# Patient Record
Sex: Male | Born: 1985 | Race: White | Hispanic: No | Marital: Married | Smoking: Never smoker
Health system: Southern US, Community
[De-identification: ages and names within clinical notes are randomized; demographics above are authoritative.]

## PROBLEM LIST (undated history)

## (undated) DIAGNOSIS — I10 Essential (primary) hypertension: Principal | ICD-10-CM

## (undated) HISTORY — DX: Essential (primary) hypertension: I10

---

## 2010-11-03 ENCOUNTER — Ambulatory Visit (INDEPENDENT_AMBULATORY_CARE_PROVIDER_SITE_OTHER): Payer: BC Managed Care – PPO | Admitting: Family Medicine

## 2010-11-03 ENCOUNTER — Encounter: Payer: Self-pay | Admitting: Family Medicine

## 2010-11-03 DIAGNOSIS — R7309 Other abnormal glucose: Secondary | ICD-10-CM

## 2010-11-03 NOTE — Patient Instructions (Signed)
Return for a physical with fasting labs in 6 mos.

## 2010-11-03 NOTE — Progress Notes (Signed)
  Subjective:    Patient ID: Ryan Hubbard, male    DOB: 1986-03-23, 25 y.o.   MRN: [id number]  HPI  25 yo WM presents for NOV.  He is doing well.  He wanted to establish care.  Had labs done at work last year and his fasting glucose was 101.  His mom had DM.  He eats healthy and exercises regularly.  Denies any problems.  Takes no meds.  BP 146/58  Pulse 56  Ht 6' (1.829 m)  Wt 173 lb (78.472 kg)  BMI 23.46 kg/m2  SpO2 98%  History reviewed. No pertinent past medical history.  History reviewed. No pertinent past surgical history.  Family History  Problem Relation Age of Onset  . Cancer Mother   . Diabetes Mother     History   Social History  . Marital Status: N/A    Spouse Name: N/A    Number of Children: 0  . Years of Education: 16   Occupational History  . 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 Smoking status: Never Smoker   . Smokeless tobacco: Not on file  . Alcohol Use: 1.2 oz/week    2 Shots of liquor per week  . Drug Use: No  . Sexually Active: Yes   Other Topics Concern  . Not on file   Social History Narrative  . No narrative on file    Not on File  No current outpatient prescriptions on file.     Review of Systems  Constitutional: Negative for fever, fatigue and unexpected weight change.  HENT: Negative for congestion and sore throat.   Eyes: Negative for visual disturbance.  Respiratory: Negative for shortness of breath.   Cardiovascular: Negative for chest pain and palpitations.  Gastrointestinal: Negative for nausea, abdominal pain, diarrhea, constipation and blood in stool.  Genitourinary: Negative for difficulty urinating.  Musculoskeletal: Negative for myalgias and arthralgias.  Neurological: Negative for headaches.  Psychiatric/Behavioral: Negative for dysphoric mood. The patient is not nervous/anxious.        Objective:   Physical Exam  Constitutional: He appears well-developed and well-nourished. No distress.    HENT:  Head: Normocephalic and atraumatic.  Neck: No thyromegaly present.  Cardiovascular: Normal rate, regular rhythm and normal heart sounds.   No murmur heard. Pulmonary/Chest: Effort normal and breath sounds normal. No respiratory distress. He has no wheezes.  Musculoskeletal: He exhibits no edema.  Skin: Skin is warm and dry.  Psychiatric: He has a normal mood and affect.          Assessment & Plan:  Glucose from 2011 was 101 fasting, only 1 point above normal with fam hx of T2DM.  He has a normal BMI, eats healthy and is definitely exercising more.  I don't see a reason to screen him now since he is not fasting today.  Will get labs with his physical in 6 mos.

## 2011-05-06 ENCOUNTER — Ambulatory Visit: Payer: BC Managed Care – PPO | Admitting: Family Medicine

## 2012-09-10 ENCOUNTER — Encounter: Payer: Self-pay | Admitting: Emergency Medicine

## 2012-09-10 ENCOUNTER — Emergency Department (INDEPENDENT_AMBULATORY_CARE_PROVIDER_SITE_OTHER)
Admission: EM | Admit: 2012-09-10 | Discharge: 2012-09-10 | Disposition: A | Discharge status: Home / Self Care | Payer: BC Managed Care – PPO | Source: Home / Self Care | Attending: Family Medicine | Admitting: Family Medicine

## 2012-09-10 DIAGNOSIS — S61209A Unspecified open wound of unspecified finger without damage to nail, initial encounter: Secondary | ICD-10-CM

## 2012-09-10 DIAGNOSIS — Z23 Encounter for immunization: Secondary | ICD-10-CM

## 2012-09-10 MED ORDER — TETANUS-DIPHTH-ACELL PERTUSSIS 5-2.5-18.5 LF-MCG/0.5 IM SUSP
0.5000 mL | Freq: Once | INTRAMUSCULAR | Status: AC
  Administered 2012-09-10: 0.5 mL via INTRAMUSCULAR

## 2012-09-10 NOTE — ED Provider Notes (Signed)
History     CSN: [id number]  Arrival date & time 09/10/12  2017   First MD Initiated Contact with Patient 09/10/12 2024      Chief Complaint  Patient presents with  . Extremity Laceration       HPI Comments: Patient accidentally cut his left 4th fingertip with a knife today.  He is not sure about his last Tdap  Patient is a 27 y.o. male presenting with skin laceration. The history is provided by the patient.  Laceration Location:  Finger Finger laceration location:  L ring finger Length (cm):  6mm Depth:  Through dermis Quality: avulsion   Bleeding: controlled with pressure   Laceration mechanism:  Knife Pain details:    Quality:  Aching   Severity:  Mild Foreign body present:  No foreign bodies Tetanus status:  Out of date   History reviewed. No pertinent past medical history.  History reviewed. No pertinent past surgical history.  Family History  Problem Relation Age of Onset  . Cancer Mother   . Diabetes Mother     History  Substance Use Topics  . Smoking status: Never Smoker   . Smokeless tobacco: Not on file  . Alcohol Use: 1.2 oz/week    2 Shots of liquor per week      Review of Systems  All other systems reviewed and are negative.    Allergies  Review of patient's allergies indicates no known allergies.  Home Medications  No current outpatient prescriptions on file.  BP 147/76  Pulse 96  Temp(Src) 98.4 F (36.9 C) (Oral)  Ht 6' (1.829 m)  Wt 185 lb (83.915 kg)  BMI 25.08 kg/m2  SpO2 100%  Physical Exam  Nursing note and vitals reviewed. Constitutional: He is oriented to person, place, and time. He appears well-developed and well-nourished. No distress.  Eyes: Conjunctivae are normal. Pupils are equal, round, and reactive to light.  Musculoskeletal:       Hands: Left 4th finger has a 6mm dia superficial skin avulsion as noted on diagram.  Wound is clean without debris.  Neurological: He is alert and oriented to person, place, and  time.  Skin: Skin is warm and dry.    ED Course  Procedures  Cleansed wound with HibiClens and Saline.  Applied Surgicil.  After bleeding ceased, applied Mepelex Lite dressing and Bacitracin ointment.      1. Avulsion of skin of finger, initial encounter       MDM  Tdap administered. Applied Mepelex Lite dressing over thin layer of Bacitracin ointment, followed by light compression dressing and stack splint.  Recommend that he leave Mepelex in place for about 3 to 4 days, then change (gave remaining piece of Mepelex Lite).  Return for any signs of infection        Lattie Haw, MD 09/11/12 1113

## 2012-09-10 NOTE — ED Notes (Signed)
Left fourth finger laceration with a knife

## 2012-09-13 ENCOUNTER — Telehealth: Payer: Self-pay | Admitting: Emergency Medicine

## 2013-05-28 ENCOUNTER — Encounter: Payer: Self-pay | Admitting: Family Medicine

## 2013-05-28 ENCOUNTER — Ambulatory Visit (INDEPENDENT_AMBULATORY_CARE_PROVIDER_SITE_OTHER): Payer: BC Managed Care – PPO

## 2013-05-28 ENCOUNTER — Ambulatory Visit (INDEPENDENT_AMBULATORY_CARE_PROVIDER_SITE_OTHER): Payer: BC Managed Care – PPO | Admitting: Family Medicine

## 2013-05-28 VITALS — BP 153/93 | HR 67 | Wt 190.0 lb

## 2013-05-28 DIAGNOSIS — M25539 Pain in unspecified wrist: Secondary | ICD-10-CM

## 2013-05-28 DIAGNOSIS — M6740 Ganglion, unspecified site: Secondary | ICD-10-CM

## 2013-05-28 DIAGNOSIS — IMO0001 Reserved for inherently not codable concepts without codable children: Secondary | ICD-10-CM

## 2013-05-28 DIAGNOSIS — M25532 Pain in left wrist: Secondary | ICD-10-CM

## 2013-05-28 DIAGNOSIS — R030 Elevated blood-pressure reading, without diagnosis of hypertension: Secondary | ICD-10-CM

## 2013-05-28 HISTORY — DX: Ganglion, unspecified site: M67.40

## 2013-05-28 NOTE — Patient Instructions (Signed)
DASH Diet  The DASH diet stands for "Dietary Approaches to Stop Hypertension." It is a healthy eating plan that has been shown to reduce high blood pressure (hypertension) in as little as 14 days, while also possibly providing other significant health benefits. These other health benefits include reducing the risk of breast cancer after menopause and reducing the risk of type 2 diabetes, heart disease, colon cancer, and stroke. Health benefits also include weight loss and slowing kidney failure in patients with chronic kidney disease.   DIET GUIDELINES  · Limit salt (sodium). Your diet should contain less than 1500 mg of sodium daily.  · Limit refined or processed carbohydrates. Your diet should include mostly whole grains. Desserts and added sugars should be used sparingly.  · Include small amounts of heart-healthy fats. These types of fats include nuts, oils, and tub margarine. Limit saturated and trans fats. These fats have been shown to be harmful in the body.  CHOOSING FOODS   The following food groups are based on a 2000 calorie diet. See your Registered Dietitian for individual calorie needs.  Grains and Grain Products (6 to 8 servings daily)  · Eat More Often: Whole-wheat bread, brown rice, whole-grain or wheat pasta, quinoa, popcorn without added fat or salt (air popped).  · Eat Less Often: White bread, white pasta, white rice, cornbread.  Vegetables (4 to 5 servings daily)  · Eat More Often: Fresh, frozen, and canned vegetables. Vegetables may be raw, steamed, roasted, or grilled with a minimal amount of fat.  · Eat Less Often/Avoid: Creamed or fried vegetables. Vegetables in a cheese sauce.  Fruit (4 to 5 servings daily)  · Eat More Often: All fresh, canned (in natural juice), or frozen fruits. Dried fruits without added sugar. One hundred percent fruit juice (½ cup [237 mL] daily).  · Eat Less Often: Dried fruits with added sugar. Canned fruit in light or heavy syrup.  Lean Meats, Fish, and Poultry (2  servings or less daily. One serving is 3 to 4 oz [85-114 g]).  · Eat More Often: Ninety percent or leaner ground beef, tenderloin, sirloin. Round cuts of beef, chicken breast, turkey breast. All fish. Grill, bake, or broil your meat. Nothing should be fried.  · Eat Less Often/Avoid: Fatty cuts of meat, turkey, or chicken leg, thigh, or wing. Fried cuts of meat or fish.  Dairy (2 to 3 servings)  · Eat More Often: Low-fat or fat-free milk, low-fat plain or light yogurt, reduced-fat or part-skim cheese.  · Eat Less Often/Avoid: Milk (whole, 2%). Whole milk yogurt. Full-fat cheeses.  Nuts, Seeds, and Legumes (4 to 5 servings per week)  · Eat More Often: All without added salt.  · Eat Less Often/Avoid: Salted nuts and seeds, canned beans with added salt.  Fats and Sweets (limited)  · Eat More Often: Vegetable oils, tub margarines without trans fats, sugar-free gelatin. Mayonnaise and salad dressings.  · Eat Less Often/Avoid: Coconut oils, palm oils, butter, stick margarine, cream, half and half, cookies, candy, pie.  FOR MORE INFORMATION  The Dash Diet Eating Plan: www.dashdiet.org  Document Released: 06/09/2011 Document Revised: 09/12/2011 Document Reviewed: 06/09/2011  [employer]® Patient Information ©2014 [employer].

## 2013-05-28 NOTE — Progress Notes (Addendum)
CC: Ryan Hubbard is a 27 y.o. male is here for ganglion cyst ?   Subjective: HPI:  Left wrist pain that has been present for one month described as dull ache at the base of the thumb lateral surface of wrist nonradiating. Worse with heavy lifting and cross fit. Reports fracturing base of thumb in high school. Pain is been preceded by a small lump at the base of his thumb near the site of discomfort that has been present for 6 months has not been getting better or worse in size but is slightly tender over the past 2 weeks. No interventions as of yet. Denies decreased range of motion or any particular motion that causes pain of either issues above. Pain is presently daily basis mild/moderate severity, can occur anytime of the day. Nothing makes better or worse other than above  Patient reports that his blood pressure has always been perfect well below 140/90 when checked at his work, most recently checked 3 months ago. Strong family history of heart disease. He reports that he does indulge in a high sodium diet but does exercise for at least an hour most days of the week. Denies chest pain, shortness of breath, orthopnea, peripheral edema, headache, motor sensory disturbances   Review Of Systems Outlined In HPI  History reviewed. No pertinent past medical history.   Family History  Problem Relation Age of Onset  . Cancer Mother   . Diabetes Mother      History  Substance Use Topics  . Smoking status: Never Smoker   . Smokeless tobacco: Not on file  . Alcohol Use: 1.2 oz/week    2 Shots of liquor per week     Objective: Filed Vitals:   05/28/13 1320  BP: 153/93  Pulse: 67    General: Alert and Oriented, No Acute Distress HEENT: Pupils equal, round, reactive to light. Conjunctivae clear. Moist mucous membranes pharynx unremarkable Lungs: Clear to auscultation bilaterally, no wheezing/ronchi/rales.  Comfortable work of breathing. Good air movement. Cardiac: Regular rate and rhythm.  Normal S1/S2.  No murmurs, rubs, nor gallops.   Extremities: No peripheral edema.  Strong peripheral pulses. Radiocarpal region on the palmar lateral surface of the left wrist has a approximately 1 cm firm Subcutaneous nodule that is fixed and non-pulsatile.  On ultrasound there is a hypoechoic single-chamber cystic structure corresponding with this nodule , it is nontender . The left wrist has full range of motion and strength without pain in anatomical snuff box and a negative Lourena Simmonds there is no redness or warmth to any aspect of the left wrist.  Mental Status: No depression, anxiety, nor agitation. Skin: Warm and dry.  Assessment & Plan: Ryan Hubbard was seen today for ganglion cyst ?.  Diagnoses and associated orders for this visit:  Left wrist pain - DG Wrist Complete Left; Future  Ganglion cyst  Elevated BP    Left wrist pain: Obtain x-ray given his remote history of fracture, suspicious that arthritis may be contributing to his ganglion cyst. If there is radiographic evidence of arthritis I will send him to Dr. Karie Schwalbe. for sports medicine referral for consideration of ganglion cyst aspiration plus or minus steroid injection  elevated blood pressure: Patient was counseled on the Dash diet and exercise interventions to help reduce blood pressure, if his blood pressure is still above 140/90 at followup visit with sports medicine I like him to follow up with me as soon as possible to discuss antihypertensive medications   Return in about 1 week (  around 06/04/2013).

## 2013-06-04 ENCOUNTER — Ambulatory Visit (INDEPENDENT_AMBULATORY_CARE_PROVIDER_SITE_OTHER): Payer: BC Managed Care – PPO | Admitting: Sports Medicine

## 2013-06-04 ENCOUNTER — Encounter: Payer: Self-pay | Admitting: Sports Medicine

## 2013-06-04 VITALS — BP 151/88 | HR 87 | Wt 191.0 lb

## 2013-06-04 DIAGNOSIS — M6740 Ganglion, unspecified site: Secondary | ICD-10-CM

## 2013-06-04 NOTE — Progress Notes (Signed)
   Subjective:    I'm seeing this patient as a consultation for:  Dr. Ivan Anchors  CC: Left wrist mass  HPI: This is a very pleasant 27 year old male, for several months now he's had a mass he localizes in the volar radial aspect of his left wrist, he doesn't have any recent injuries but does lots of lifting, and power cleans in the gym. The mass is nontender except when he is lifting weights. Localized, moderate, persistent.  Past medical history, Surgical history, Family history not pertinant except as noted below, Social history, Allergies, and medications have been entered into the medical record, reviewed, and no changes needed.   Review of Systems: No headache, visual changes, nausea, vomiting, diarrhea, constipation, dizziness, abdominal pain, skin rash, fevers, chills, night sweats, weight loss, swollen lymph nodes, body aches, joint swelling, muscle aches, chest pain, shortness of breath, mood changes, visual or auditory hallucinations.   Objective:   General: Well Developed, well nourished, and in no acute distress.  Neuro/Psych: Alert and oriented x3, extra-ocular muscles intact, able to move all 4 extremities, sensation grossly intact. Skin: Warm and dry, no rashes noted.  Respiratory: Not using accessory muscles, speaking in full sentences, trachea midline.  Cardiovascular: Pulses palpable, no extremity edema. Abdomen: Does not appear distended. Left Wrist: There is a visible and palpable for ganglion cyst in close approximation with the radial artery. Palpation is normal over metacarpals, navicular, lunate, and TFCC; tendons without tenderness/ swelling No snuffbox tenderness. No tenderness over Canal of Guyon. Strength 5/5 in all directions without pain. Negative Finkelstein, tinel's and phalens. Negative Watson's test.  Procedure: Real-time Ultrasound Guided aspiration/Injection of volar wrist ganglion cyst. Device: GE Logiq E  Verbal informed consent obtained.  Time-out  conducted.  Noted no overlying erythema, induration, or other signs of local infection.  Skin prepped in a sterile fashion.  Local anesthesia: Topical Ethyl chloride.  With sterile technique and under real time ultrasound guidance:  Using 5 cc of lidocaine with epinephrine local anesthesia was applied, afterwards an 18-gauge needle was advanced into the ganglion cyst under real-time guidance, a small amount of thick fluid is aspirated, syringe was switched and 0.5 cc Kenalog 40, 0.5 cc lidocaine injected easily. Completed without difficulty  Pain immediately resolved suggesting accurate placement of the medication.  Advised to call if fevers/chills, erythema, induration, drainage, or persistent bleeding.  Images permanently stored and available for review in the ultrasound unit.  Impression: Technically successful ultrasound guided injection.  Impression and Recommendations:   This case required medical decision making of moderate complexity.

## 2013-06-04 NOTE — Assessment & Plan Note (Signed)
Aspiration and injection as above. Take it easy in the gym for the next week. Return to see me in one month.

## 2013-06-20 ENCOUNTER — Ambulatory Visit: Payer: BC Managed Care – PPO | Admitting: Family Medicine

## 2013-07-02 ENCOUNTER — Encounter: Payer: Self-pay | Admitting: Sports Medicine

## 2013-07-02 ENCOUNTER — Ambulatory Visit (INDEPENDENT_AMBULATORY_CARE_PROVIDER_SITE_OTHER): Payer: BC Managed Care – PPO | Admitting: Sports Medicine

## 2013-07-02 VITALS — BP 143/69 | HR 87 | Wt 186.0 lb

## 2013-07-02 DIAGNOSIS — M6740 Ganglion, unspecified site: Secondary | ICD-10-CM

## 2013-07-02 NOTE — Assessment & Plan Note (Signed)
Cyst remains as expected, there is a 50% chance of recurrence. Pain however is completely gone, he may return to see me on an as needed basis. Standard of care is typically 3 aspiration and injection procedures before considering surgical intervention, if pain-free we need to do nothing.

## 2013-07-02 NOTE — Progress Notes (Signed)
  Subjective:    CC: Followup  HPI: Left volar wrist ganglion cyst: Cyst has returned after aspiration and injection however it is completely pain-free, he is happy with results and has no limitations.  Past medical history, Surgical history, Family history not pertinant except as noted below, Social history, Allergies, and medications have been entered into the medical record, reviewed, and no changes needed.   Review of Systems: No fevers, chills, night sweats, weight loss, chest pain, or shortness of breath.   Objective:    General: Well Developed, well nourished, and in no acute distress.  Neuro: Alert and oriented x3, extra-ocular muscles intact, sensation grossly intact.  HEENT: Normocephalic, atraumatic, pupils equal round reactive to light, neck supple, no masses, no lymphadenopathy, thyroid nonpalpable.  Skin: Warm and dry, no rashes. Cardiac: Regular rate and rhythm, no murmurs rubs or gallops, no lower extremity edema.  Respiratory: Clear to auscultation bilaterally. Not using accessory muscles, speaking in full sentences. Left Wrist: There is a visible and palpable nontender 0.5 cm volar radial ganglion cyst.. Palpation is normal over metacarpals, navicular, lunate, and TFCC; tendons without tenderness/ swelling No snuffbox tenderness. No tenderness over Canal of Guyon. Strength 5/5 in all directions without pain. Negative Finkelstein, tinel's and phalens. Negative Watson's test.  Impression and Recommendations:

## 2013-07-08 ENCOUNTER — Ambulatory Visit (INDEPENDENT_AMBULATORY_CARE_PROVIDER_SITE_OTHER): Payer: BC Managed Care – PPO | Admitting: Family Medicine

## 2013-07-08 ENCOUNTER — Encounter: Payer: Self-pay | Admitting: Family Medicine

## 2013-07-08 VITALS — BP 147/86 | HR 76 | Wt 185.0 lb

## 2013-07-08 DIAGNOSIS — M6740 Ganglion, unspecified site: Secondary | ICD-10-CM

## 2013-07-08 DIAGNOSIS — I10 Essential (primary) hypertension: Secondary | ICD-10-CM

## 2013-07-08 HISTORY — DX: Essential (primary) hypertension: I10

## 2013-07-08 LAB — BASIC METABOLIC PANEL WITH GFR
BUN: 17 mg/dL (ref 6–23)
CALCIUM: 9.5 mg/dL (ref 8.4–10.5)
CO2: 26 mEq/L (ref 19–32)
CREATININE: 1.11 mg/dL (ref 0.50–1.35)
Chloride: 104 mEq/L (ref 96–112)
GLUCOSE: 92 mg/dL (ref 70–99)
Potassium: 4.4 mEq/L (ref 3.5–5.3)
Sodium: 141 mEq/L (ref 135–145)

## 2013-07-08 NOTE — Progress Notes (Signed)
CC: Ryan Hubbard is a 28 y.o. male is here for Follow-up   Subjective: HPI:  Followup essential hypertension: Patient continues to work out most days of the week he feels that he's been somewhat successful with reducing sodium in the diet. He admits it's been quite hard over the holiday season to consistently keep sodium intake to a minimum. Denies exertional chest pain, shortness of breath, motor sensory disturbances, peripheral edema.  Followup ganglion cyst: Patient states that the swelling is still present however there is absolutely no pain whatsoever. He is back to doing whatever he wants with his wrists including strength training without pain.   Review Of Systems Outlined In HPI  Past Medical History  Diagnosis Date  . Essential hypertension, benign 07/08/2013     Family History  Problem Relation Age of Onset  . Cancer Mother   . Diabetes Mother      History  Substance Use Topics  . Smoking status: Never Smoker   . Smokeless tobacco: Not on file  . Alcohol Use: 1.2 oz/week    2 Shots of liquor per week     Objective: Filed Vitals:   07/08/13 0812  BP: 147/86  Pulse: 76    General: Alert and Oriented, No Acute Distress HEENT: Pupils equal, round, reactive to light. Conjunctivae clear.  Moist membranes pharynx unremarkable Lungs: Clear to auscultation bilaterally, no wheezing/ronchi/rales.  Comfortable work of breathing. Good air movement. Cardiac: Regular rate and rhythm. Normal S1/S2.  No murmurs, rubs, nor gallops.   Extremities: No peripheral edema.  Strong peripheral pulses.  Approximate 0.5 cm ganglion cyst on the volar left wrist which is nonpainful, nonpulsatile. Full range of motion strength of the left wrist Mental Status: No depression, anxiety, nor agitation. Skin: Warm and dry.  Assessment & Plan: Ryan Hubbard was seen today for follow-up.  Diagnoses and associated orders for this visit:  Essential hypertension, benign - BASIC METABOLIC PANEL WITH  GFR  Ganglion cyst    Essential hypertension: Discuss new diagnosis and the benefits of starting medication such as hydrochlorothiazide at this time. Patient is adamant about avoiding medications, we discussed diet and exercise to help lower blood pressure. He is optimistic that he can keep to a more consistent sodium restriction now that the holidays are over. Checking renal function today return in 3 months for reevaluation of blood pressure. Ganglion cyst: Controlled no further intervention needed  Return in about 3 months (around 10/06/2013).

## 2013-10-07 ENCOUNTER — Ambulatory Visit: Payer: BC Managed Care – PPO | Admitting: Family Medicine

## 2013-10-21 ENCOUNTER — Ambulatory Visit (INDEPENDENT_AMBULATORY_CARE_PROVIDER_SITE_OTHER): Payer: BC Managed Care – PPO | Admitting: Family Medicine

## 2013-10-21 ENCOUNTER — Encounter: Payer: Self-pay | Admitting: Family Medicine

## 2013-10-21 VITALS — BP 147/84 | HR 77 | Ht 72.0 in | Wt 185.0 lb

## 2013-10-21 DIAGNOSIS — M6740 Ganglion, unspecified site: Secondary | ICD-10-CM

## 2013-10-21 DIAGNOSIS — I10 Essential (primary) hypertension: Secondary | ICD-10-CM

## 2013-10-21 MED ORDER — HYDROCHLOROTHIAZIDE 25 MG PO TABS: ORAL_TABLET | ORAL | Status: DC

## 2013-10-21 NOTE — Progress Notes (Signed)
CC: Ryan Hubbard is a 28 y.o. male is here for Follow-up   Subjective: HPI:  Followup essential hypertension: Continues to work out most days of the week and also pays close attention to sodium restriction. He reports one outside blood pressure 130/70 when checked at work. Denies chest pain, shortness of breath, orthopnea, peripheral edema, motor or sensory disturbances  Followup ganglion cyst: Continues to be pain-free in the left wrist despite some persistent swelling. Symptoms are not interfering with quality of life or preventing him from using his left wrist for any particular activity   Review Of Systems Outlined In HPI  Past Medical History  Diagnosis Date  . Essential hypertension, benign 07/08/2013    No past surgical history on file. Family History  Problem Relation Age of Onset  . Cancer Mother   . Diabetes Mother     History   Social History  . Marital Status: Married    Spouse Name: N/A    Number of Children: 0  . Years of Education: 16   Occupational History  . 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 Smoking status: Never Smoker   . Smokeless tobacco: Not on file  . Alcohol Use: 1.2 oz/week    2 Shots of liquor per week  . Drug Use: No  . Sexual Activity: Yes   Other Topics Concern  . Not on file   Social History Narrative  . No narrative on file     Objective: BP 147/84  Pulse 77  Ht 6' (1.829 m)  Wt 185 lb (83.915 kg)  BMI 25.08 kg/m2  General: Alert and Oriented, No Acute Distress HEENT: Pupils equal, round, reactive to light. Conjunctivae clear.  Moist mucous membranes pharynx unremarkable Lungs: Clear to auscultation bilaterally, no wheezing/ronchi/rales.  Comfortable work of breathing. Good air movement. Cardiac: Regular rate and rhythm. Normal S1/S2.  No murmurs, rubs, nor gallops.   Extremities: No peripheral edema.  Strong peripheral pulses. Approximate 0.5 cm ganglion cyst on the volar left wrist which is nonpainful,  nonpulsatile. Mental Status: No depression, anxiety, nor agitation. Skin: Warm and dry.  Assessment & Plan: Thayer OhmChris was seen today for follow-up.  Diagnoses and associated orders for this visit:  Essential hypertension, benign - hydrochlorothiazide (HYDRODIURIL) 25 MG tablet; One tablet by mouth every morning for blood pressure control.  Ganglion cyst    Essential hypertension: Uncontrolled chronic condition start hydrochlorothiazide continue diet and exercise interventions Ganglion cyst: asymptomatic and stable we'll follow clinically   Return in about 4 weeks (around 11/18/2013).

## 2013-11-20 ENCOUNTER — Encounter: Payer: Self-pay | Admitting: Family Medicine

## 2013-11-20 ENCOUNTER — Ambulatory Visit (INDEPENDENT_AMBULATORY_CARE_PROVIDER_SITE_OTHER): Payer: BC Managed Care – PPO | Admitting: Family Medicine

## 2013-11-20 VITALS — BP 125/85 | HR 84 | Wt 185.0 lb

## 2013-11-20 DIAGNOSIS — I10 Essential (primary) hypertension: Secondary | ICD-10-CM

## 2013-11-20 DIAGNOSIS — J302 Other seasonal allergic rhinitis: Secondary | ICD-10-CM

## 2013-11-20 DIAGNOSIS — J309 Allergic rhinitis, unspecified: Secondary | ICD-10-CM

## 2013-11-20 MED ORDER — CETIRIZINE HCL 10 MG PO CAPS: ORAL_CAPSULE | ORAL | Status: DC

## 2013-11-20 MED ORDER — HYDROCHLOROTHIAZIDE 25 MG PO TABS: ORAL_TABLET | ORAL | Status: DC

## 2013-11-20 NOTE — Progress Notes (Signed)
CC: Ryan Hubbard is a 28 y.o. male is here for Hypertension   Subjective: HPI:  Followup essential hypertension: Since I saw him last he's been taking hydrochlorothiazide on a daily basis without missed doses. No known side effects or intolerances. Denies muscle cramps.  Has not checked blood pressure outside of our office.  states that he feels better overall and he can put his finger on it but no longer feels like his blood pressure is elevated . Denies chest pain, shortness of breath, orthopnea, peripheral edema, limb claudication or regular heartbeat  Complains of mild nasal congestion, frontal sinusitis tenderness/pressure. Worse more time he spends outside. Has been present for the past 2-3 weeks. Similar symptoms [place] No interventions as of yet. Denies fevers, chills, cough, shortness of breath, wheezing, nor skin changes   Review Of Systems Outlined In HPI  Past Medical History  Diagnosis Date  . Essential hypertension, benign 07/08/2013    No past surgical history on file. Family History  Problem Relation Age of Onset  . Cancer Mother   . Diabetes Mother     History   Social History  . Marital Status: Married    Spouse Name: N/A    Number of Children: 0  . Years of Education: 16   Occupational History  . 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 Smoking status: Never Smoker   . Smokeless tobacco: Not on file  . Alcohol Use: 1.2 oz/week    2 Shots of liquor per week  . Drug Use: No  . Sexual Activity: Yes   Other Topics Concern  . Not on file   Social History Narrative  . No narrative on file     Objective: BP 125/85  Pulse 84  Wt 185 lb (83.915 kg)  General: Alert and Oriented, No Acute Distress HEENT: Pupils equal, round, reactive to light. Conjunctivae clear.  External ears unremarkable, canals clear with intact TMs with appropriate landmarks.  Middle ear appears open  without effusion. Pink inferior turbinates.  Moist mucous membranes, pharynx without inflammation nor lesions however moderate postnasal drip.  Neck supple without palpable lymphadenopathy nor abnormal masses. Lungs: Clear to auscultation bilaterally, no wheezing/ronchi/rales.  Comfortable work of breathing. Good air movement. Cardiac: Regular rate and rhythm. Normal S1/S2.  No murmurs, rubs, nor gallops.   Mental Status: No depression, anxiety, nor agitation. Skin: Warm and dry.  Assessment & Plan: Ryan Hubbard was seen today for hypertension.  Diagnoses and associated orders for this visit:  Essential hypertension, benign - hydrochlorothiazide (HYDRODIURIL) 25 MG tablet; One tablet by mouth every morning for blood pressure control.  Seasonal allergies  Other Orders - Cetirizine HCl (ZYRTEC ALLERGY) 10 MG CAPS; One PO QD    Essential hypertension: Continue hydrochlorothiazide, initially blood pressure was elevated when he was taken back to the room however after 2 minutes of sitting down and relaxing blood pressure was in the pre-hypertensive range. Seasonal allergies are likely contributing to his frontal sinus complaints therefore start Zyrtec.   Return in about 3 months (around 02/20/2014).

## 2014-03-26 ENCOUNTER — Ambulatory Visit (INDEPENDENT_AMBULATORY_CARE_PROVIDER_SITE_OTHER): Payer: BC Managed Care – PPO | Admitting: Family Medicine

## 2014-03-26 ENCOUNTER — Encounter: Payer: Self-pay | Admitting: Family Medicine

## 2014-03-26 VITALS — BP 151/81 | HR 72 | Ht 72.0 in | Wt 193.0 lb

## 2014-03-26 DIAGNOSIS — J302 Other seasonal allergic rhinitis: Secondary | ICD-10-CM

## 2014-03-26 DIAGNOSIS — J309 Allergic rhinitis, unspecified: Secondary | ICD-10-CM

## 2014-03-26 DIAGNOSIS — I10 Essential (primary) hypertension: Secondary | ICD-10-CM

## 2014-03-26 MED ORDER — LISINOPRIL 20 MG PO TABS: ORAL_TABLET | ORAL | Status: DC

## 2014-03-26 NOTE — Progress Notes (Signed)
CC: Ryan Hubbard is a 28 y.o. male is here for Follow-up   Subjective: HPI:  Followup essential hypertension: Continues on hydrochlorothiazide taking this on a daily basis without known side effects. He's been checking his blood pressure about 1-2 times a month since I saw him last and it is consistently below 140/90. He is working out 3-4 times a week, he continues to be conscious of keeping his sodium consumption low. He denies chest pain shortness of breath orthopnea peripheral edema, motor or sensory disturbances.  Followup allergies: He forgot to go pick up Zyrtec and stated that his allergic sinusitis and rhinitis resolved without intervention after a few weeks after I saw him last. He denies any facial pressure, nasal congestion, sore throat, cough, wheezing nor itching.   Review Of Systems Outlined In HPI  Past Medical History  Diagnosis Date  . Essential hypertension, benign 07/08/2013    No past surgical history on file. Family History  Problem Relation Age of Onset  . Cancer Mother   . Diabetes Mother     History   Social History  . Marital Status: Married    Spouse Name: N/A    Number of Children: 0  . Years of Education: 16   Occupational History  . 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 Smoking status: Never Smoker   . Smokeless tobacco: Not on file  . Alcohol Use: 1.2 oz/week    2 Shots of liquor per week  . Drug Use: No  . Sexual Activity: Yes   Other Topics Concern  . Not on file   Social History Narrative  . No narrative on file     Objective: BP 151/81  Pulse 72  Ht 6' (1.829 m)  Wt 193 lb (87.544 kg)  BMI 26.17 kg/m2  General: Alert and Oriented, No Acute Distress HEENT: Pupils equal, round, reactive to light. Conjunctivae clear.  Moist mucous membranes pharynx unremarkable Lungs: Clear to auscultation bilaterally, no wheezing/ronchi/rales.  Comfortable work of breathing. Good air movement. Cardiac: Regular rate and  rhythm. Normal S1/S2.  No murmurs, rubs, nor gallops.   Extremities: No peripheral edema.  Strong peripheral pulses.  Mental Status: No depression, anxiety, nor agitation. Skin: Warm and dry.  Assessment & Plan: [name] was seen today for follow-up.  Diagnoses and associated orders for this visit:  Essential hypertension, benign - lisinopril (PRINIVIL,ZESTRIL) 20 MG tablet; One tablet by mouth daily for blood pressure control.  Seasonal allergies    Seasonal allergies: Resolved and currently controlled, consider starting over-the-counter Zyrtec if any symptoms return between now and when I see him next Essential hypertension: Chronic uncontrolled condition, after 2 rechecks after resting he was still in stage I hypertension range therefore stop hydrochlorothiazide switching to lisinopril. Return in one month for renal function and blood pressure check   Return in about 4 weeks (around 04/23/2014).

## 2014-04-25 ENCOUNTER — Ambulatory Visit: Payer: BC Managed Care – PPO | Admitting: Family Medicine

## 2014-04-28 ENCOUNTER — Ambulatory Visit (INDEPENDENT_AMBULATORY_CARE_PROVIDER_SITE_OTHER): Payer: BC Managed Care – PPO | Admitting: Family Medicine

## 2014-04-28 ENCOUNTER — Encounter: Payer: Self-pay | Admitting: Family Medicine

## 2014-04-28 VITALS — BP 148/82 | HR 73 | Wt 193.0 lb

## 2014-04-28 DIAGNOSIS — I10 Essential (primary) hypertension: Secondary | ICD-10-CM

## 2014-04-28 DIAGNOSIS — J302 Other seasonal allergic rhinitis: Secondary | ICD-10-CM

## 2014-04-28 MED ORDER — LISINOPRIL-HYDROCHLOROTHIAZIDE 20-25 MG PO TABS: 1.0000 | ORAL_TABLET | Freq: Every day | ORAL | Status: DC

## 2014-04-28 NOTE — Progress Notes (Signed)
CC: Ryan Hubbard is a 28 y.o. male is here for Hypertension   Subjective: HPI:  Follow-up hypertension: Has been taking blood pressures outside of our office and reports that values remained stage I hypertension. Denies chest pain shortness of breath orthopnea nor peripheral edema. He is decreasing sodium in his diet by eating leftovers from home instead of eating out. He's working out most days of the week. Denies chest pain shortness of breath orthopnea nor peripheral edema. Denies motor or sensory disturbances  Follow-up allergies: Reported that over the past week nasal congestion has increased and sinus pressure more annoying. Symptoms are mild in severity worse in the morning and worse when outside. Denies fevers, chills, cough, shortness of breath, wheezing   Review Of Systems Outlined In HPI  Past Medical History  Diagnosis Date  . Essential hypertension, benign 07/08/2013    No past surgical history on file. Family History  Problem Relation Age of Onset  . Cancer Mother   . Diabetes Mother     History   Social History  . Marital Status: Married    Spouse Name: N/A    Number of Children: 0  . Years of Education: 16   Occupational History  . 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 Smoking status: Never Smoker   . Smokeless tobacco: Not on file  . Alcohol Use: 1.2 oz/week    2 Shots of liquor per week  . Drug Use: No  . Sexual Activity: Yes   Other Topics Concern  . Not on file   Social History Narrative  . No narrative on file     Objective: BP 148/82  Pulse 73  Wt 193 lb (87.544 kg)  General: Alert and Oriented, No Acute Distress HEENT: Pupils equal, round, reactive to light. Conjunctivae clear.  Moist mucous membranes pharynx unremarkable no nasal congestion. Lungs: Clear to auscultation bilaterally, no wheezing/ronchi/rales.  Comfortable work of breathing. Good air movement. Cardiac: Regular rate and rhythm. Normal S1/S2.  No murmurs,  rubs, nor gallops.   Extremities: No peripheral edema.  Strong peripheral pulses.  Mental Status: No depression, anxiety, nor agitation. Skin: Warm and dry.  Assessment & Plan: Ryan Hubbard was seen today for hypertension.  Diagnoses and associated orders for this visit:  Essential hypertension, benign - lisinopril-hydrochlorothiazide (PRINZIDE,ZESTORETIC) 20-25 MG per tablet; Take 1 tablet by mouth daily.  Seasonal allergies    Essential hypertension: Uncontrolled, continue lisinopril at adding back hydrochlorothiazide component. Continue sodium restriction and physical activity Seasonal allergies: Controlled, continue Zyrtec call if worsening because we could always add Singulair  Return for 1-2 months, Blood Pressure.

## 2014-06-06 ENCOUNTER — Ambulatory Visit (INDEPENDENT_AMBULATORY_CARE_PROVIDER_SITE_OTHER): Payer: BC Managed Care – PPO | Admitting: Family Medicine

## 2014-06-06 ENCOUNTER — Encounter: Payer: Self-pay | Admitting: Family Medicine

## 2014-06-06 VITALS — BP 154/82 | HR 88 | Ht 72.0 in | Wt 191.0 lb

## 2014-06-06 DIAGNOSIS — I10 Essential (primary) hypertension: Secondary | ICD-10-CM

## 2014-06-06 MED ORDER — OLMESARTAN MEDOXOMIL-HCTZ 40-12.5 MG PO TABS: 1.0000 | ORAL_TABLET | Freq: Every day | ORAL | Status: DC

## 2014-06-06 NOTE — Progress Notes (Signed)
CC: Ryan Hubbard is a 28 y.o. male is here for Hypertension   Subjective: HPI:  Follow-up hypertension: Continues on lisinopril-hydrochlorothiazide on a daily basis with no outside blood pressures report and no known side effects. Denies chest pain shortness of breath orthopnea nor peripheral edema   Review Of Systems Outlined In HPI  Past Medical History  Diagnosis Date  . Essential hypertension, benign 07/08/2013    No past surgical history on file. Family History  Problem Relation Age of Onset  . Cancer Mother   . Diabetes Mother     History   Social History  . Marital Status: Married    Spouse Name: N/A    Number of Children: 0  . Years of Education: 16   Occupational History  . 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 Smoking status: Never Smoker   . Smokeless tobacco: Not on file  . Alcohol Use: 1.2 oz/week    2 Shots of liquor per week  . Drug Use: No  . Sexual Activity: Yes   Other Topics Concern  . Not on file   Social History Narrative     Objective: BP 154/82 mmHg  Pulse 88  Ht 6' (1.829 m)  Wt 191 lb (86.637 kg)  BMI 25.90 kg/m2  General: Alert and Oriented, No Acute Distress HEENT: Pupils equal, round, reactive to light. Conjunctivae clear.   Lungs: Clear to auscultation bilaterally, no wheezing/ronchi/rales.  Comfortable work of breathing. Good air movement. Cardiac: Regular rate and rhythm. Normal S1/S2.  No murmurs, rubs, nor gallops.   Extremities: No peripheral edema.  Strong peripheral pulses.  Mental Status: No depression, anxiety, nor agitation. Skin: Warm and dry.  Assessment & Plan: Ryan Hubbard was seen today for hypertension.  Diagnoses and associated orders for this visit:  Essential hypertension, benign  Other Orders - olmesartan-hydrochlorothiazide (BENICAR HCT) 40-12.5 MG per tablet; Take 1 tablet by mouth daily.   essential hypertension: Uncontrolled chronic condition, stop lisinopril-hydrochlorothiazide and  begin Benicar HCT. Samples were provided. Advised him to call me if he is getting lightheaded or dizzy after taking this medication. Return in about 4 weeks (around 07/04/2014).

## 2014-07-07 ENCOUNTER — Ambulatory Visit (INDEPENDENT_AMBULATORY_CARE_PROVIDER_SITE_OTHER): Payer: BLUE CROSS/BLUE SHIELD | Admitting: Family Medicine

## 2014-07-07 ENCOUNTER — Encounter: Payer: Self-pay | Admitting: Family Medicine

## 2014-07-07 VITALS — BP 140/70 | HR 91 | Wt 194.0 lb

## 2014-07-07 DIAGNOSIS — I10 Essential (primary) hypertension: Secondary | ICD-10-CM

## 2014-07-07 MED ORDER — OLMESARTAN MEDOXOMIL-HCTZ 40-25 MG PO TABS: 1.0000 | ORAL_TABLET | Freq: Every day | ORAL | Status: DC

## 2014-07-07 NOTE — Progress Notes (Signed)
CC: Ryan Hubbard is a 29 y.o. male is here for Hypertension   Subjective: HPI:  Has been taking Benicar HCT for the past month now. He states he feels overall better. In hindsight on the prior medication he was able to hear his heartbeat if he was lying on his right side. He's had some mild lightheadedness when quickly going from a seated to standing position however symptoms are not interfering quality-of-life and last only a matter of seconds. No outside blood pressures to report. He denies chest pain shortness of breath orthopnea peripheral edema nor any motor or sensory disturbances.   Review Of Systems Outlined In HPI  Past Medical History  Diagnosis Date  . Essential hypertension, benign 07/08/2013    No past surgical history on file. Family History  Problem Relation Age of Onset  . Cancer Mother   . Diabetes Mother     History   Social History  . Marital Status: Married    Spouse Name: N/A    Number of Children: 0  . Years of Education: 16   Occupational History  . 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 Smoking status: Never Smoker   . Smokeless tobacco: Not on file  . Alcohol Use: 1.2 oz/week    2 Shots of liquor per week  . Drug Use: No  . Sexual Activity: Yes   Other Topics Concern  . Not on file   Social History Narrative     Objective: BP 140/70 mmHg  Pulse 91  Wt 194 lb (87.998 kg)  Vital signs reviewed. General: Alert and Oriented, No Acute Distress HEENT: Pupils equal, round, reactive to light. Conjunctivae clear.  External ears unremarkable.  Moist mucous membranes. Lungs: Clear and comfortable work of breathing, speaking in full sentences without accessory muscle use. Cardiac: Regular rate and rhythm.  Neuro: CN II-XII grossly intact, gait normal. Extremities: No peripheral edema.  Strong peripheral pulses.  Mental Status: No depression, anxiety, nor agitation. Logical though process. Skin: Warm and dry.  Assessment &  Plan: [name] was seen today for hypertension.  Diagnoses and associated orders for this visit:  Essential hypertension, benign - olmesartan-hydrochlorothiazide (BENICAR HCT) 40-25 MG per tablet; Take 1 tablet by mouth daily.    Essential hypertension: Uncontrolled but improved, increasing hydrochlorothiazide component from 12.5 mg to 25 mg. Follow-up in 1-3 months for complete physical exam and will need renal function checked at that time.  Return in about 3 months (around 10/06/2014) for Complete Physical Exam.

## 2014-07-14 ENCOUNTER — Telehealth: Payer: Self-pay | Admitting: *Deleted

## 2014-07-14 NOTE — Telephone Encounter (Signed)
Pt is calling checking on the status PA on his medication. Ryan Hubbard can you please check on this

## 2014-07-14 NOTE — Telephone Encounter (Signed)
Benicar approval received and [facility] called and patient notified.

## 2014-08-07 ENCOUNTER — Encounter: Payer: Self-pay | Admitting: Family Medicine

## 2014-08-07 ENCOUNTER — Ambulatory Visit (INDEPENDENT_AMBULATORY_CARE_PROVIDER_SITE_OTHER): Payer: BLUE CROSS/BLUE SHIELD | Admitting: Family Medicine

## 2014-08-07 VITALS — BP 156/79 | HR 99 | Temp 98.4°F | Wt 195.0 lb

## 2014-08-07 DIAGNOSIS — B9689 Other specified bacterial agents as the cause of diseases classified elsewhere: Secondary | ICD-10-CM

## 2014-08-07 DIAGNOSIS — A499 Bacterial infection, unspecified: Secondary | ICD-10-CM

## 2014-08-07 DIAGNOSIS — J329 Chronic sinusitis, unspecified: Secondary | ICD-10-CM | POA: Diagnosis not present

## 2014-08-07 MED ORDER — DOXYCYCLINE HYCLATE 100 MG PO TABS: ORAL_TABLET | ORAL | Status: DC

## 2014-08-07 NOTE — Progress Notes (Signed)
CC: Ryan Hubbard is a 29 y.o. male is here for chest congestion?   Subjective: HPI:   pressure in the forehead with nonproductive cough that has been present since the past weekend. Symptoms were mild in severity up until last night when the deteriorated to a moderate -severe degree. It was accompanied by cold sweats and chills that awoke him from his sleep. Forehead pain is worse with coughing or leaning forward. Interventions have included NyQuil and Motrin both of which helped for a few hours.  Reports fatigue subjective fevers and chills today.  Nothing else seems and make symptoms better or worse other than above. Present all hours of the day. Denies wheezing, shortness of breath, blood in sputum, chest pain, confusion normal motor sensory disturbances  Review Of Systems Outlined In HPI  Past Medical History  Diagnosis Date  . Essential hypertension, benign 07/08/2013    No past surgical history on file. Family History  Problem Relation Age of Onset  . Cancer Mother   . Diabetes Mother     History   Social History  . Marital Status: Married    Spouse Name: N/A    Number of Children: 0  . Years of Education: 16   Occupational History  . 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 Smoking status: Never Smoker   . Smokeless tobacco: Not on file  . Alcohol Use: 1.2 oz/week    2 Shots of liquor per week  . Drug Use: No  . Sexual Activity: Yes   Other Topics Concern  . Not on file   Social History Narrative     Objective: BP 156/79 mmHg  Pulse 99  Temp(Src) 98.4 F (36.9 C) (Oral)  Wt 195 lb (88.451 kg)  SpO2 98%  General: Alert and Oriented, No Acute Distress HEENT: Pupils equal, round, reactive to light. Conjunctivae clear.  External ears unremarkable, canals clear with intact TMs with appropriate landmarks.  Middle ear appears open without effusion. Pink inferior turbinates.  Moist mucous membranes, pharynx without inflammation nor lesions  Other  than moderate postnasal drip.  Neck supple without palpable lymphadenopathy nor abnormal masses. Lungs: Clear to auscultation bilaterally, no wheezing/ronchi/rales.  Comfortable work of breathing. Good air movement.  neuro cranial nerves II through XII grossly intact Extremities: No peripheral edema.  Strong peripheral pulses.  Mental Status: No depression, anxiety, nor agitation. Skin: Warm and dry.  Assessment & Plan: Thayer OhmChris was seen today for chest congestion?.  Diagnoses and associated orders for this visit:  Bacterial sinusitis - doxycycline (VIBRA-TABS) 100 MG tablet; One by mouth twice a day for ten days.     spectral sinusitis: Start doxycycline consider nasal saline washes continue as needed Motrin and DayQuil/NyQuil products.  Return if symptoms worsen or fail to improve.

## 2014-08-15 ENCOUNTER — Encounter: Payer: Self-pay | Admitting: Family Medicine

## 2014-08-15 ENCOUNTER — Ambulatory Visit (INDEPENDENT_AMBULATORY_CARE_PROVIDER_SITE_OTHER): Payer: BLUE CROSS/BLUE SHIELD | Admitting: Family Medicine

## 2014-08-15 VITALS — BP 128/67 | HR 77 | Temp 98.3°F | Ht 72.0 in | Wt 189.0 lb

## 2014-08-15 DIAGNOSIS — R5383 Other fatigue: Secondary | ICD-10-CM

## 2014-08-15 DIAGNOSIS — R110 Nausea: Secondary | ICD-10-CM | POA: Diagnosis not present

## 2014-08-15 DIAGNOSIS — R509 Fever, unspecified: Secondary | ICD-10-CM

## 2014-08-15 LAB — COMPLETE METABOLIC PANEL WITH GFR
ALBUMIN: 4.7 g/dL (ref 3.5–5.2)
ALK PHOS: 68 U/L (ref 39–117)
ALT: 32 U/L (ref 0–53)
AST: 23 U/L (ref 0–37)
BUN: 14 mg/dL (ref 6–23)
CHLORIDE: 101 meq/L (ref 96–112)
CO2: 26 mEq/L (ref 19–32)
CREATININE: 1.17 mg/dL (ref 0.50–1.35)
Calcium: 9.7 mg/dL (ref 8.4–10.5)
GFR, EST NON AFRICAN AMERICAN: 84 mL/min
GFR, Est African American: >89 mL/min
Glucose, Bld: 92 mg/dL (ref 70–99)
Potassium: 4.2 mEq/L (ref 3.5–5.3)
Sodium: 141 mEq/L (ref 135–145)
TOTAL PROTEIN: 6.7 g/dL (ref 6.0–8.3)
Total Bilirubin: 0.5 mg/dL (ref 0.2–1.2)

## 2014-08-15 LAB — CBC WITH DIFFERENTIAL/PLATELET
BASOS PCT: 0 % (ref 0–1)
Basophils Absolute: 0 10*3/uL (ref 0.0–0.1)
EOS ABS: 0 10*3/uL (ref 0.0–0.7)
EOS PCT: 0 % (ref 0–5)
HCT: 41 % (ref 39.0–52.0)
Hemoglobin: 13.8 g/dL (ref 13.0–17.0)
Lymphocytes Relative: 47 % — ABNORMAL HIGH (ref 12–46)
Lymphs Abs: 2.6 10*3/uL (ref 0.7–4.0)
MCH: 31.8 pg (ref 26.0–34.0)
MCHC: 33.6 g/dL (ref 30.0–36.0)
MCV: 94.4 fL (ref 78.0–100.0)
MONOS PCT: 8 % (ref 3–12)
Monocytes Absolute: 0.4 10*3/uL (ref 0.1–1.0)
NEUTROS PCT: 45 % (ref 43–77)
Neutro Abs: 2.5 10*3/uL (ref 1.7–7.7)
Platelets: 250 10*3/uL (ref 150–400)
RBC: 4.34 MIL/uL (ref 4.22–5.81)
RDW: 12.9 % (ref 11.5–15.5)
WBC: 5.6 10*3/uL (ref 4.0–10.5)

## 2014-08-15 LAB — MONONUCLEOSIS SCREEN: Mono Screen: NEGATIVE

## 2014-08-15 MED ORDER — ONDANSETRON HCL 4 MG PO TABS
8.0000 mg | ORAL_TABLET | Freq: Once | ORAL | Status: AC
  Administered 2014-08-15: 8 mg via ORAL

## 2014-08-15 NOTE — Progress Notes (Signed)
CC: Ryan Hubbard is a 29 y.o. male is here for Sinus Problem   Subjective: HPI:  Fever that occurred over the weekend maximum temperature 102.0. Ended on Monday however symptoms of fatigue nauseous this and decreased appetite with cough has been persistent ever since the weekend. Symptoms are moderate to severe in severity. Is at the point where he feels like he could fall sleep at his desk while working. He tells me is a struggle to get out of bed and to stay awake throughout the day which is entirely abnormal for him. Nothing particular significant symptoms better. Nothing particular seems to make it worse. He has stopped taking doxycycline. He had some loose stools on this medication but this seems to be improving without any intervention other than stopping the medication a few days ago. Denies chest pain, wheezing, shortness of breath, abdominal pain, confusion, sinus pressure and nasal congestion or sore throat   Review Of Systems Outlined In HPI  Past Medical History  Diagnosis Date  . Essential hypertension, benign 07/08/2013    No past surgical history on file. Family History  Problem Relation Age of Onset  . Cancer Mother   . Diabetes Mother     History   Social History  . Marital Status: Married    Spouse Name: N/A  . Number of Children: 0  . Years of Education: 16   Occupational History  . 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 Smoking status: Never Smoker   . Smokeless tobacco: Not on file  . Alcohol Use: 1.2 oz/week    2 Shots of liquor per week  . Drug Use: No  . Sexual Activity: Yes   Other Topics Concern  . Not on file   Social History Narrative     Objective: BP 128/67 mmHg  Pulse 77  Temp(Src) 98.3 F (36.8 C) (Oral)  Ht 6' (1.829 m)  Wt 189 lb (85.73 kg)  BMI 25.63 kg/m2  General: Alert and Oriented, No Acute Distress HEENT: Pupils equal, round, reactive to light. Conjunctivae clear.  External ears unremarkable, canals clear  with intact TMs with appropriate landmarks.  Middle ear appears open without effusion. Pink inferior turbinates.  Moist mucous membranes, pharynx without inflammation nor lesions.  Neck supple without palpable lymphadenopathy nor abnormal masses. Lungs: Clear to auscultation bilaterally, no wheezing/ronchi/rales.  Comfortable work of breathing. Good air movement. Cardiac: Regular rate and rhythm. Normal S1/S2.  No murmurs, rubs, nor gallops.   Abdomen: Normal bowel sounds, soft and non tender without palpable masses. Mental Status: No depression, anxiety, nor agitation. Skin: Warm and dry.  Assessment & Plan: Ryan Hubbard was seen today for sinus problem.  Diagnoses and all orders for this visit:  Other fatigue Orders: -     Monospot -     CBC w/Diff -     COMPLETE METABOLIC PANEL WITH GFR  Fever, unspecified fever cause Orders: -     Monospot -     CBC w/Diff -     COMPLETE METABOLIC PANEL WITH GFR   Exam today does not really point to a clear source of his collection of symptoms. Will rule out mono and check differential to see if it looks at a viral or bacterial infection. Metabolic panel for his nausea. He was given 8 mg of Zofran here to see if this helps, I will get in touch with him later today once labs are back for final recommendations.  Return if symptoms  worsen or fail to improve.

## 2014-09-05 ENCOUNTER — Encounter: Payer: Self-pay | Admitting: Family Medicine

## 2014-09-05 ENCOUNTER — Ambulatory Visit (INDEPENDENT_AMBULATORY_CARE_PROVIDER_SITE_OTHER): Payer: BLUE CROSS/BLUE SHIELD | Admitting: Family Medicine

## 2014-09-05 VITALS — BP 115/67 | HR 85 | Ht 72.0 in | Wt 189.0 lb

## 2014-09-05 DIAGNOSIS — Z0000 Encounter for general adult medical examination without abnormal findings: Secondary | ICD-10-CM | POA: Diagnosis not present

## 2014-09-05 LAB — LIPID PANEL
CHOL/HDL RATIO: 2.2 ratio
CHOLESTEROL: 147 mg/dL (ref 0–200)
HDL: 67 mg/dL (ref >=40)
LDL Cholesterol: 73 mg/dL (ref 0–99)
Triglycerides: 36 mg/dL (ref <150)
VLDL: 7 mg/dL (ref 0–40)

## 2014-09-05 NOTE — Patient Instructions (Signed)
Dr. Camyah Pultz's General Advice Following Your Complete Physical Exam  The Benefits of Regular Exercise: Unless you suffer from an uncontrolled cardiovascular condition, studies strongly suggest that regular exercise and physical activity will add to both the quality and length of your life.  The World Health Organization recommends 150 minutes of moderate intensity aerobic activity every week.  This is best split over 3-4 days a week, and can be as simple as a brisk walk for just over 35 minutes "most days of the week".  This type of exercise has been shown to lower LDL-Cholesterol, lower average blood sugars, lower blood pressure, lower cardiovascular disease risk, improve memory, and increase one's overall sense of wellbeing.  The addition of anaerobic (or "strength training") exercises offers additional benefits including but not limited to increased metabolism, prevention of osteoporosis, and improved overall cholesterol levels.  How Can I Strive For A Low-Fat Diet?: Current guidelines recommend that 25-35 percent of your daily energy (food) intake should come from fats.  One might ask how can this be achieved without having to dissect each meal on a daily basis?  Switch to skim or 1% milk instead of whole milk.  Focus on lean meats such as ground turkey, fresh fish, baked chicken, and lean cuts of beef as your source of dietary protein.  Limit saturated fat consumption to less than 10% of your daily caloric intake.  Limit trans fatty acid consumption primarily by limiting synthetic trans fats such as partially hydrogenated oils (Ex: fried fast foods).  Substitute olive or vegetable oil for solid fats where possible.  Moderation of Salt Intake: Provided you don't carry a diagnosis of congestive heart failure nor renal failure, I recommend a daily allowance of no more than 2300 mg of salt (sodium).  Keeping under this daily goal is associated with a decreased risk of cardiovascular events, creeping  above it can lead to elevated blood pressures and increases your risk of cardiovascular events.  Milligrams (mg) of salt is listed on all nutrition labels, and your daily intake can add up faster than you think.  Most canned and frozen dinners can pack in over half your daily salt allowance in one meal.    Lifestyle Health Risks: Certain lifestyle choices carry specific health risks.  As you may already know, tobacco use has been associated with increasing one's risk of cardiovascular disease, pulmonary disease, numerous cancers, among many other issues.  What you may not know is that there are medications and nicotine replacement strategies that can more than double your chances of successfully quitting.  I would be thrilled to help manage your quitting strategy if you currently use tobacco products.  When it comes to alcohol use, I've yet to find an "ideal" daily allowance.  Provided an individual does not have a medical condition that is exacerbated by alcohol consumption, general guidelines determine "safe drinking" as no more than two standard drinks for a man or no more than one standard drink for a male per day.  However, much debate still exists on whether any amount of alcohol consumption is technically "safe".  My general advice, keep alcohol consumption to a minimum for general health promotion.  If you or others believe that alcohol, tobacco, or recreational drug use is interfering with your life, I would be happy to provide confidential counseling regarding treatment options.  General "Over The Counter" Nutrition Advice: Postmenopausal women should aim for a daily calcium intake of 1200 mg, however a significant portion of this might already be   provided by diets including milk, yogurt, cheese, and other dairy products.  Vitamin D has been shown to help preserve bone density, prevent fatigue, and has even been shown to help reduce falls in the elderly.  Ensuring a daily intake of 800 Units of  Vitamin D is a good place to start to enjoy the above benefits, we can easily check your Vitamin D level to see if you'd potentially benefit from supplementation beyond 800 Units a day.  Folic Acid intake should be of particular concern to women of childbearing age.  Daily consumption of 400-800 mcg of Folic Acid is recommended to minimize the chance of spinal cord defects in a fetus should pregnancy occur.    For many adults, accidents still remain one of the most common culprits when it comes to cause of death.  Some of the simplest but most effective preventitive habits you can adopt include regular seatbelt use, proper helmet use, securing firearms, and regularly testing your smoke and carbon monoxide detectors.  Ryan Hubbard B. Ryan Yarbough DO [facility] [address], [address] [place], [place] [address] Phone: [phone]  

## 2014-09-05 NOTE — Progress Notes (Signed)
CC: Ryan Hubbard is a 29 y.o. male is here for Annual Exam   Subjective: HPI:  Colonoscopy: No family history of colon cancer will begin screening at age 41  Prostate: Discussed screening risks/beneifts with patient during today's visit, and no family history of prostate cancer will be considering screening at age 30  Influenza Vaccine: Declined Pneumovax: No current indication Td/Tdap: UTD 2014 Zoster: (Start 29 yo)  Rare alcohol use no tobacco or recreational drug use   Review of Systems - General ROS: negative for - chills, fever, night sweats, weight gain or weight loss Ophthalmic ROS: negative for - decreased vision Psychological ROS: negative for - anxiety or depression ENT ROS: negative for - hearing change, nasal congestion, tinnitus or allergies Hematological and Lymphatic ROS: negative for - bleeding problems, bruising or swollen lymph nodes Breast ROS: negative Respiratory ROS: no cough, shortness of breath, or wheezing Cardiovascular ROS: no chest pain or dyspnea on exertion Gastrointestinal ROS: no abdominal pain, change in bowel habits, or black or bloody stools Genito-Urinary ROS: negative for - genital discharge, genital ulcers, incontinence or abnormal bleeding from genitals Musculoskeletal ROS: negative for - joint pain or muscle pain Neurological ROS: negative for - headaches or memory loss Dermatological ROS: negative for lumps, mole changes, rash and skin lesion changes  Past Medical History  Diagnosis Date  . Essential hypertension, benign 07/08/2013    No past surgical history on file. Family History  Problem Relation Age of Onset  . Cancer Mother   . Diabetes Mother     History   Social History  . Marital Status: Married    Spouse Name: N/A  . Number of Children: 0  . Years of Education: 16   Occupational History  . 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 Smoking status: Never Smoker   . Smokeless tobacco: Not on file  .  Alcohol Use: 1.2 oz/week    2 Shots of liquor per week  . Drug Use: No  . Sexual Activity: Yes   Other Topics Concern  . Not on file   Social History Narrative     Objective: BP 115/67 mmHg  Pulse 85  Ht 6' (1.829 m)  Wt 189 lb (85.73 kg)  BMI 25.63 kg/m2  General: No Acute Distress HEENT: Atraumatic, normocephalic, conjunctivae normal without scleral icterus.  No nasal discharge, hearing grossly intact, TMs with good landmarks bilaterally with no middle ear abnormalities, posterior pharynx clear without oral lesions. Neck: Supple, trachea midline, no cervical nor supraclavicular adenopathy. Pulmonary: Clear to auscultation bilaterally without wheezing, rhonchi, nor rales. Cardiac: Regular rate and rhythm.  No murmurs, rubs, nor gallops. No peripheral edema.  2+ peripheral pulses bilaterally. Abdomen: Bowel sounds normal.  No masses.  Non-tender without rebound.  Negative Murphy's sign. GU: Bilateral descended testes MSK: Grossly intact, no signs of weakness.  Full strength throughout upper and lower extremities.  Full ROM in upper and lower extremities.  No midline spinal tenderness. Neuro: Gait unremarkable, CN II-XII grossly intact.  C5-C6 Reflex 2/4 Bilaterally, L4 Reflex 2/4 Bilaterally.  Cerebellar function intact. Skin: No rashes. Psych: Alert and oriented to person/place/time.  Thought process normal. No anxiety/depression.  Assessment & Plan: Ryan Hubbard was seen today for annual exam.  Diagnoses and all orders for this visit:  Annual physical exam Orders: -     Lipid panel  Healthy lifestyle interventions including but not limited to regular exercise, a healthy low fat diet, moderation of salt intake,  the dangers of tobacco/alcohol/recreational drug use, nutrition supplementation, and accident avoidance were discussed with the patient and a handout was provided for future reference.  Return in about 3 months (around 12/06/2014).

## 2014-12-08 ENCOUNTER — Ambulatory Visit (INDEPENDENT_AMBULATORY_CARE_PROVIDER_SITE_OTHER): Payer: BLUE CROSS/BLUE SHIELD | Admitting: Family Medicine

## 2014-12-08 ENCOUNTER — Encounter: Payer: Self-pay | Admitting: Family Medicine

## 2014-12-08 VITALS — BP 130/76 | HR 75 | Ht 70.0 in | Wt 195.0 lb

## 2014-12-08 DIAGNOSIS — I10 Essential (primary) hypertension: Secondary | ICD-10-CM

## 2014-12-08 MED ORDER — OLMESARTAN MEDOXOMIL-HCTZ 40-25 MG PO TABS: 1.0000 | ORAL_TABLET | Freq: Every day | ORAL | Status: DC

## 2014-12-08 NOTE — Progress Notes (Signed)
CC: Ryan Hubbard is a 29 y.o. male is here for Follow-up   Subjective: HPI:  Follow-up essential hypertension: Continues to take Benicar HCT on a daily basis. No known side effects. Working out most days of the week. Denies any chest pain shortness of breath or peripheral edema. No motor or sensory disturbances. He had blood work through his employer 2 weeks ago showing normal renal function and no other abnormalities. Results will be scanned into the electronic medical record. Had blood pressure checked at work a few days ago 106/70. Denies lightheadedness.   Review Of Systems Outlined In HPI  Past Medical History  Diagnosis Date  . Essential hypertension, benign 07/08/2013    No past surgical history on file. Family History  Problem Relation Age of Onset  . Cancer Mother   . Diabetes Mother     History   Social History  . Marital Status: Married    Spouse Name: N/A  . Number of Children: 0  . Years of Education: 16   Occupational History  . 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 Smoking status: Never Smoker   . Smokeless tobacco: Not on file  . Alcohol Use: 1.2 oz/week    2 Shots of liquor per week  . Drug Use: No  . Sexual Activity: Yes   Other Topics Concern  . Not on file   Social History Narrative     Objective: BP 130/76 mmHg  Pulse 75  Ht 5\' 10"  (1.778 m)  Wt 195 lb (88.451 kg)  BMI 27.98 kg/m2  General: Alert and Oriented, No Acute Distress HEENT: Pupils equal, round, reactive to light. Conjunctivae clear.  Moist mucous membranes Lungs: Clear to auscultation bilaterally, no wheezing/ronchi/rales.  Comfortable work of breathing. Good air movement. Cardiac: Regular rate and rhythm. Normal S1/S2.  No murmurs, rubs, nor gallops.   Extremities: No peripheral edema.  Strong peripheral pulses.  Mental Status: No depression, anxiety, nor agitation. Skin: Warm and dry.  Assessment & Plan: Ryan Hubbard was seen today for follow-up.  Diagnoses  and all orders for this visit:  Essential hypertension, benign   Essential hypertension: Controlled continue Benicar/HCT follow-up in 6 months.  Return in about 6 months (around 06/09/2015) for BP.

## 2015-01-06 ENCOUNTER — Other Ambulatory Visit: Payer: Self-pay | Admitting: Family Medicine

## 2015-04-10 ENCOUNTER — Ambulatory Visit (INDEPENDENT_AMBULATORY_CARE_PROVIDER_SITE_OTHER): Payer: BLUE CROSS/BLUE SHIELD | Admitting: Family Medicine

## 2015-04-10 ENCOUNTER — Encounter: Payer: Self-pay | Admitting: Family Medicine

## 2015-04-10 VITALS — BP 115/58 | HR 73 | Temp 98.1°F | Resp 16 | Ht 72.0 in | Wt 195.0 lb

## 2015-04-10 DIAGNOSIS — J0100 Acute maxillary sinusitis, unspecified: Secondary | ICD-10-CM | POA: Diagnosis not present

## 2015-04-10 DIAGNOSIS — J329 Chronic sinusitis, unspecified: Secondary | ICD-10-CM | POA: Insufficient documentation

## 2015-04-10 MED ORDER — PREDNISONE 10 MG PO TABS: 30.0000 mg | ORAL_TABLET | Freq: Every day | ORAL | Status: DC

## 2015-04-10 MED ORDER — IPRATROPIUM BROMIDE 0.06 % NA SOLN: 2.0000 | Freq: Four times a day (QID) | NASAL | Status: DC

## 2015-04-10 NOTE — Patient Instructions (Signed)
Thank you for coming in today. Atrovent nasal spray. Take over-the-counter Zyrtec-D and Tylenol. Use prednisone if not better. Call or go to the emergency room if you get worse, have trouble breathing, have chest pains, or palpitations.   Sinusitis, Adult Sinusitis is redness, soreness, and inflammation of the paranasal sinuses. Paranasal sinuses are air pockets within the bones of your face. They are located beneath your eyes, in the middle of your forehead, and above your eyes. In healthy paranasal sinuses, mucus is able to drain out, and air is able to circulate through them by way of your nose. However, when your paranasal sinuses are inflamed, mucus and air can become trapped. This can allow bacteria and other germs to grow and cause infection. Sinusitis can develop quickly and last only a short time (acute) or continue over a long period (chronic). Sinusitis that lasts for more than 12 weeks is considered chronic. CAUSES Causes of sinusitis include:  Allergies.  Structural abnormalities, such as displacement of the cartilage that separates your nostrils (deviated septum), which can decrease the air flow through your nose and sinuses and affect sinus drainage.  Functional abnormalities, such as when the small hairs (cilia) that line your sinuses and help remove mucus do not work properly or are not present. SIGNS AND SYMPTOMS Symptoms of acute and chronic sinusitis are the same. The primary symptoms are pain and pressure around the affected sinuses. Other symptoms include:  Upper toothache.  Earache.  Headache.  Bad breath.  Decreased sense of smell and taste.  A cough, which worsens when you are lying flat.  Fatigue.  Fever.  Thick drainage from your nose, which often is green and may contain pus (purulent).  Swelling and warmth over the affected sinuses. DIAGNOSIS Your health care provider will perform a physical exam. During your exam, your health care provider may  perform any of the following to help determine if you have acute sinusitis or chronic sinusitis:  Look in your nose for signs of abnormal growths in your nostrils (nasal polyps).  Tap over the affected sinus to check for signs of infection.  View the inside of your sinuses using an imaging device that has a light attached (endoscope). If your health care provider suspects that you have chronic sinusitis, one or more of the following tests may be recommended:  Allergy tests.  Nasal culture. A sample of mucus is taken from your nose, sent to a lab, and screened for bacteria.  Nasal cytology. A sample of mucus is taken from your nose and examined by your health care provider to determine if your sinusitis is related to an allergy. TREATMENT Most cases of acute sinusitis are related to a viral infection and will resolve on their own within 10 days. Sometimes, medicines are prescribed to help relieve symptoms of both acute and chronic sinusitis. These may include pain medicines, decongestants, nasal steroid sprays, or saline sprays. However, for sinusitis related to a bacterial infection, your health care provider will prescribe antibiotic medicines. These are medicines that will help kill the bacteria causing the infection. Rarely, sinusitis is caused by a fungal infection. In these cases, your health care provider will prescribe antifungal medicine. For some cases of chronic sinusitis, surgery is needed. Generally, these are cases in which sinusitis recurs more than 3 times per year, despite other treatments. HOME CARE INSTRUCTIONS  Drink plenty of water. Water helps thin the mucus so your sinuses can drain more easily.  Use a humidifier.  Inhale steam 3-4 times  a day (for example, sit in the bathroom with the shower running).  Apply a warm, moist washcloth to your face 3-4 times a day, or as directed by your health care provider.  Use saline nasal sprays to help moisten and clean your  sinuses.  Take medicines only as directed by your health care provider.  If you were prescribed either an antibiotic or antifungal medicine, finish it all even if you start to feel better. SEEK IMMEDIATE MEDICAL CARE IF:  You have increasing pain or severe headaches.  You have nausea, vomiting, or drowsiness.  You have swelling around your face.  You have vision problems.  You have a stiff neck.  You have difficulty breathing.   This information is not intended to replace advice given to you by your health care provider. Make sure you discuss any questions you have with your health care provider.   Document Released: 06/20/2005 Document Revised: 07/11/2014 Document Reviewed: 07/05/2011 Elsevier Interactive Patient Education [employer]

## 2015-04-10 NOTE — Progress Notes (Signed)
  Ryan Hubbard is a 29 y.o. male who presents to [facility]  today for sinus congestion. He states the congestion started about a week ago and is mild, but annoying. He states that this feels similar to other sinus infections he has had in the past however not as bad. He has developed a cough this morning which he attributes to the post-nasal drip. He states that he has had a constant headache all week which is helped with ibuprofen during the day and NyQuil at night. Additionally, he is experiencing ear pressure and a feeling of being off balance at times. He denies fever, wheezing, chest pain, shortness of breath, nausea, vomiting, abdominal pain, changes in bowel habits, arthralgias or myalgias.    Past Medical History  Diagnosis Date  . Essential hypertension, benign 07/08/2013   No past surgical history on file. Social History  Substance Use Topics  . Smoking status: Never Smoker   . Smokeless tobacco: Not on file  . Alcohol Use: 1.2 oz/week    2 Shots of liquor per week   family history includes Cancer in his mother; Diabetes in his mother.  ROS as above Medications: Current Outpatient Prescriptions  Medication Sig Dispense Refill  . olmesartan-hydrochlorothiazide (BENICAR HCT) 40-25 MG per tablet Take 1 tablet by mouth daily. 30 tablet 5   No current facility-administered medications for this visit.   No Known Allergies   Exam:  BP 115/58 mmHg  Pulse 73  Temp(Src) 98.1 F (36.7 C) (Oral)  Resp 16  Ht 6' (1.829 m)  Wt 195 lb (88.451 kg)  BMI 26.44 kg/m2  SpO2 99% Gen: WDWN male in no acute distress HEENT: MMM, cobblestoning of the posterior pharynx, TMs retracted with minimal injection bilaterally.  Maxillary and frontal sinuses are nontender to percussion Lungs: Normal work of breathing. CTABL Heart: RRR no MRG Abd: NABS, Soft. Nondistended, Nontender Exts: Brisk capillary refill, warm and well perfused.   Assessment: 1.  Allergic sinusitis vs. Viral sinusitis based on patient history and PE findings.  No evidence for bacterial sinus infection  Plan: 1. Atrovent nasal spray, Zytrec-D and Tylenol for symptomatic management. Patient was given printed prescription for prednisone and instructed to use if he feels like his symptoms are worsening. Patient instructed to call office if symptoms worsen especially if he develops a fever, chills or unilateral sinus pain.

## 2015-04-14 ENCOUNTER — Telehealth: Payer: Self-pay

## 2015-04-14 MED ORDER — AMOXICILLIN-POT CLAVULANATE 875-125 MG PO TABS: 1.0000 | ORAL_TABLET | Freq: Two times a day (BID) | ORAL | Status: DC

## 2015-04-14 NOTE — Telephone Encounter (Signed)
Augmentin called in.  Return to clinic if not better.

## 2015-04-14 NOTE — Telephone Encounter (Signed)
Patient reports still being sick and would like an antibiotic. He now has a sore throat along with the congestion. Please advise.

## 2015-04-15 NOTE — Telephone Encounter (Signed)
Pt.notified

## 2015-06-10 ENCOUNTER — Encounter: Payer: Self-pay | Admitting: Family Medicine

## 2015-06-10 ENCOUNTER — Ambulatory Visit (INDEPENDENT_AMBULATORY_CARE_PROVIDER_SITE_OTHER): Payer: BLUE CROSS/BLUE SHIELD | Admitting: Family Medicine

## 2015-06-10 VITALS — BP 129/78 | HR 79 | Wt 199.0 lb

## 2015-06-10 DIAGNOSIS — I10 Essential (primary) hypertension: Secondary | ICD-10-CM | POA: Diagnosis not present

## 2015-06-10 MED ORDER — OLMESARTAN MEDOXOMIL-HCTZ 40-25 MG PO TABS: 1.0000 | ORAL_TABLET | Freq: Every day | ORAL | Status: DC

## 2015-06-10 NOTE — Progress Notes (Signed)
CC: Ryan Hubbard is a 29 y.o. male is here for Hypertension   Subjective: HPI:  Follow-up essential hypertension: Admits that he's been slacking on working out but continues to take blood pressure medication with 100% compliance. Denies chest pain shortness of breath orthopnea nor peripheral edema. Consciously watching his sodium intake. No known side effects from Benicar HCT   Review Of Systems Outlined In HPI  Past Medical History  Diagnosis Date  . Essential hypertension, benign 07/08/2013    No past surgical history on file. Family History  Problem Relation Age of Onset  . Cancer Mother   . Diabetes Mother     Social History   Social History  . Marital Status: Married    Spouse Name: N/A  . Number of Children: 0  . Years of Education: 16   Occupational History  . 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 Smoking status: Never Smoker   . Smokeless tobacco: Not on file  . Alcohol Use: 1.2 oz/week    2 Shots of liquor per week  . Drug Use: No  . Sexual Activity: Yes   Other Topics Concern  . Not on file   Social History Narrative     Objective: BP 129/78 mmHg  Pulse 79  Wt 199 lb (90.266 kg)  General: Alert and Oriented, No Acute Distress HEENT: Pupils equal, round, reactive to light. Conjunctivae clear.  Moist mucous membranes Lungs: Clear to auscultation bilaterally, no wheezing/ronchi/rales.  Comfortable work of breathing. Good air movement. Cardiac: Regular rate and rhythm. Normal S1/S2.  No murmurs, rubs, nor gallops.   Extremities: No peripheral edema.  Strong peripheral pulses.  Mental Status: No depression, anxiety, nor agitation. Skin: Warm and dry.  Assessment & Plan: Thayer OhmChris was seen today for hypertension.  Diagnoses and all orders for this visit:  Essential hypertension, benign -     olmesartan-hydrochlorothiazide (BENICAR HCT) 40-25 MG tablet; Take 1 tablet by mouth daily.   Essential hypertension: Controlled continue  Benicar HCT and follow-up in 3-6 months.  Return in about 6 months (around 12/09/2015) for Blood Pressure.

## 2015-08-13 ENCOUNTER — Telehealth: Payer: Self-pay | Admitting: Family Medicine

## 2015-08-13 NOTE — Telephone Encounter (Signed)
Received fax for prior authorization on Olmesartan sent through cover my meds waiting on authorization. - CF

## 2015-08-14 NOTE — Telephone Encounter (Signed)
Received fax from [facility] they denied coverage on Olmesartan due to patient has to try brand benicar first. - CF

## 2015-08-17 ENCOUNTER — Telehealth: Payer: Self-pay | Admitting: Family Medicine

## 2015-08-17 MED ORDER — OLMESARTAN MEDOXOMIL 40 MG PO TABS: 40.0000 mg | ORAL_TABLET | Freq: Every day | ORAL | Status: DC

## 2015-08-17 NOTE — Telephone Encounter (Signed)
Will you please let patient know that [employer] has denied coverage of his Olmesartan-HCTZ BP medication since they want him to try brand name Benicar first.  I'll send in a new Rx for this and would recommend he follow up in the next 4 weeks to see if it is effective at controlling his BP.

## 2015-08-17 NOTE — Telephone Encounter (Signed)
Pt advised.

## 2015-09-15 ENCOUNTER — Encounter: Payer: BLUE CROSS/BLUE SHIELD | Admitting: Family Medicine

## 2015-09-17 ENCOUNTER — Encounter: Payer: Self-pay | Admitting: Family Medicine

## 2015-09-17 ENCOUNTER — Ambulatory Visit (INDEPENDENT_AMBULATORY_CARE_PROVIDER_SITE_OTHER): Payer: BLUE CROSS/BLUE SHIELD | Admitting: Family Medicine

## 2015-09-17 VITALS — BP 120/75 | HR 80 | Wt 198.0 lb

## 2015-09-17 DIAGNOSIS — I10 Essential (primary) hypertension: Secondary | ICD-10-CM | POA: Diagnosis not present

## 2015-09-17 MED ORDER — OLMESARTAN MEDOXOMIL 40 MG PO TABS: 40.0000 mg | ORAL_TABLET | Freq: Every day | ORAL | Status: DC

## 2015-09-17 NOTE — Progress Notes (Signed)
CC: Ryan Hubbard is a 30 y.o. male is here for Hypertension   Subjective: HPI:  FU HTN: Taking Benicar w/o any known side effects.  Insurance would not cover BenicarHCT without trying this medication first. BPs at home are consistently below 120/80. No chest pain, Sob, orthopnea, nor peripheral edema. Following the DASH diet.   Review Of Systems Outlined In HPI  Past Medical History  Diagnosis Date  . Essential hypertension, benign 07/08/2013    No past surgical history on file. Family History  Problem Relation Age of Onset  . Cancer Mother   . Diabetes Mother     Social History   Social History  . Marital Status: Married    Spouse Name: N/A  . Number of Children: 0  . Years of Education: 16   Occupational History  . 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 Smoking status: Never Smoker   . Smokeless tobacco: Not on file  . Alcohol Use: 1.2 oz/week    2 Shots of liquor per week  . Drug Use: No  . Sexual Activity: Yes   Other Topics Concern  . Not on file   Social History Narrative     Objective: BP 120/75 mmHg  Pulse 80  Wt 198 lb (89.812 kg)  General: Alert and Oriented, No Acute Distress HEENT: Pupils equal, round, reactive to light. Conjunctivae clear.   Lungs: Clear to auscultation bilaterally, no wheezing/ronchi/rales.  Comfortable work of breathing. Good air movement. Cardiac: Regular rate and rhythm. Normal S1/S2.  No murmurs, rubs, nor gallops.   Extremities: No peripheral edema.  Strong peripheral pulses.  Mental Status: No depression, anxiety, nor agitation. Skin: Warm and dry.  Assessment & Plan: Ryan Hubbard was seen today for hypertension.  Diagnoses and all orders for this visit:  Essential hypertension, benign -     olmesartan (BENICAR) 40 MG tablet; Take 1 tablet (40 mg total) by mouth daily.   BP looks great, continue Benicar and FU in 6 months.  Return in about 6 months (around 03/19/2016) for Blood Pressure.

## 2015-12-09 ENCOUNTER — Ambulatory Visit: Payer: BLUE CROSS/BLUE SHIELD | Admitting: Family Medicine

## 2015-12-25 ENCOUNTER — Telehealth: Payer: Self-pay | Admitting: *Deleted

## 2015-12-25 NOTE — Telephone Encounter (Signed)
Benicar PA submitted and denied.letter placed in box

## 2015-12-28 ENCOUNTER — Telehealth: Payer: Self-pay | Admitting: Family Medicine

## 2015-12-28 MED ORDER — OLMESARTAN MEDOXOMIL 40 MG PO TABS: 40.0000 mg | ORAL_TABLET | Freq: Every day | ORAL | Status: DC

## 2015-12-28 NOTE — Telephone Encounter (Signed)
Medication changes left vm pt advised to call with any questions

## 2015-12-28 NOTE — Telephone Encounter (Signed)
Will you please let patient know that [employer] is no longer covering brand name Benicar but they reportedly will cover the generic equivalent.  I sent an updated Rx to his [facility], please let me know if he has any difficulty getting this filled.

## 2016-03-18 ENCOUNTER — Ambulatory Visit: Payer: BLUE CROSS/BLUE SHIELD | Admitting: Family Medicine

## 2016-03-22 ENCOUNTER — Encounter: Payer: Self-pay | Admitting: Osteopathic Medicine

## 2016-03-22 ENCOUNTER — Ambulatory Visit (INDEPENDENT_AMBULATORY_CARE_PROVIDER_SITE_OTHER): Payer: BLUE CROSS/BLUE SHIELD | Admitting: Osteopathic Medicine

## 2016-03-22 VITALS — BP 132/76 | HR 73 | Ht 72.0 in | Wt 194.0 lb

## 2016-03-22 DIAGNOSIS — Z0000 Encounter for general adult medical examination without abnormal findings: Secondary | ICD-10-CM

## 2016-03-22 DIAGNOSIS — I10 Essential (primary) hypertension: Secondary | ICD-10-CM | POA: Diagnosis not present

## 2016-03-22 MED ORDER — OLMESARTAN MEDOXOMIL 40 MG PO TABS: 40.0000 mg | ORAL_TABLET | Freq: Every day | ORAL | 3 refills | Status: DC

## 2016-03-22 NOTE — Progress Notes (Signed)
HPI: Ryan Hubbard is a 30 y.o. male  who [facility] today, 03/22/16,  for chief complaint of:  Chief Complaint  Patient presents with  . Annual Exam  . Hypertension    Annual exam: See below for review of preventive care  Hypertension: Stable on Benicar as below, needs updated labs. Strong family history of hypertension and heart problems as well as diabetes.    Past medical, surgical, social and family history reviewed: Past Medical History:  Diagnosis Date  . Essential hypertension, benign 07/08/2013   No past surgical history on file. Social History  Substance Use Topics  . Smoking status: Never Smoker  . Smokeless tobacco: Not on file  . Alcohol use 1.2 oz/week    2 Shots of liquor per week   Family History  Problem Relation Age of Onset  . Cancer Mother   . Diabetes Mother      Current medication list and allergy/intolerance information reviewed:   Current Outpatient Prescriptions  Medication Sig Dispense Refill  . olmesartan (BENICAR) 40 MG tablet Take 1 tablet (40 mg total) by mouth daily. 90 tablet 1   No current facility-administered medications for this visit.    Allergies  Allergen Reactions  . Atrovent [Ipratropium]     Throat pain      Review of Systems:  Constitutional:  No  fever, no chills, No recent illness   HEENT: No  headache, no vision change, no hearing change, No sore throat, No  sinus pressure  Cardiac: No  chest pain, No  pressure  Respiratory:  No  shortness of breath. No  Cough  Gastrointestinal: No  abdominal pain, No  nausea, No  vomiting  Musculoskeletal: No new myalgia/arthralgia  Skin: No  Rash, No other wounds/concerning lesions  Hem/Onc: No  easy bruising/bleeding, No  abnormal lymph node  Endocrine: No cold intolerance,  No heat intolerance  Neurologic: No  weakness, No  dizziness  Psychiatric: No  concerns with depression, No  concerns with anxiety  Exam:  BP  132/76   Pulse 73   Ht 6' (1.829 m)   Wt 194 lb (88 kg)   BMI 26.31 kg/m   Constitutional: VS see above. General Appearance: alert, well-developed, well-nourished, NAD  Eyes: Normal lids and conjunctive, non-icteric sclera  Ears, Nose, Mouth, Throat: MMM, Normal external inspection ears/nares/mouth/lips/gums. TM normal bilaterally. Pharynx/tonsils no erythema, no exudate. Nasal mucosa normal.   Neck: No masses, trachea midline. No thyroid enlargement. No tenderness/mass appreciated. No lymphadenopathy  Respiratory: Normal respiratory effort. no wheeze, no rhonchi, no rales  Cardiovascular: S1/S2 normal, no murmur, no rub/gallop auscultated. RRR. No lower extremity edema. Pedal pulse II/IV bilaterally DP and PT  Gastrointestinal: Nontender, no masses. No hepatomegaly, no splenomegaly. No hernia appreciated. Bowel sounds normal. Rectal exam deferred.   Musculoskeletal: Gait normal. No clubbing/cyanosis of digits.   Neurological:Normal balance/coordination. No tremor.   Skin: warm, dry, intact. No rash/ulcer. No concerning nevi or subq nodules on limited exam.    Psychiatric: Normal judgment/insight. Normal mood and affect. Oriented x3.    ASSESSMENT/PLAN:   Annual physical exam - Plan: CBC with Differential/Platelet, COMPLETE METABOLIC PANEL WITH GFR, Lipid panel, HIV antibody  Essential hypertension, benign - Instructed return to clinic for nurse visit to verify home blood pressure cuff, if home cuff accurate and home BP okay, follow up every year - Plan: CBC with Differential/Platelet, COMPLETE METABOLIC PANEL WITH GFR, Lipid panel, olmesartan (BENICAR) 40 MG tablet   MALE PREVENTIVE  CARE updated 03/22/16  ANNUAL SCREENING/COUNSELING  Any changes to health in the past year? no  Tobacco - no   Alcohol - social drinker  Diet/Exercise - Healthy habits discussed to decrease CV risk and promote overall health. Patient does not have dietary restrictions.   Depression - PQH2  Negative  Feel safe at home? - no  HTN SCREENING - SEE VITALS  SEXUAL/REPRODUCTIVE HEALTH  Sexually active? - Yes with male.  STI testing needed/desired today? - no  INFECTIOUS DISEASE SCREENING  HIV - needs  GC/CT - does not need  HepC - does not need  TB - does not need  CANCER SCREENING  Lung - does not need  Colon - does not need  Prostate - does not need  OTHER DISEASE SCREENING  Lipid - needs  DM2 - needs  Osteoporosis - does not need  ADULT VACCINATION  Influenza - needs today, annual vaccine recommended  Td - already has  Zoster - was not indicated  Pneumonia - was not indicated   Visit summary with medication list and pertinent instructions was printed for patient to review. All questions at time of visit were answered - patient instructed to contact office with any additional concerns. ER/RTC precautions were reviewed with the patient. Follow-up plan: Return in about 1 year (around 03/22/2017), or sooner if needed, for [facility].

## 2016-05-31 ENCOUNTER — Encounter: Payer: Self-pay | Admitting: Sports Medicine

## 2016-05-31 ENCOUNTER — Ambulatory Visit (INDEPENDENT_AMBULATORY_CARE_PROVIDER_SITE_OTHER): Payer: BLUE CROSS/BLUE SHIELD | Admitting: Sports Medicine

## 2016-05-31 DIAGNOSIS — S46811A Strain of other muscles, fascia and tendons at shoulder and upper arm level, right arm, initial encounter: Secondary | ICD-10-CM | POA: Diagnosis not present

## 2016-05-31 DIAGNOSIS — M7591 Shoulder lesion, unspecified, right shoulder: Secondary | ICD-10-CM

## 2016-05-31 HISTORY — DX: Shoulder lesion, unspecified, right shoulder: M75.91

## 2016-05-31 MED ORDER — MELOXICAM 15 MG PO TABS: ORAL_TABLET | ORAL | 3 refills | Status: DC

## 2016-05-31 NOTE — Progress Notes (Signed)
   Subjective:    I'm seeing this patient as a consultation for:  Dr. Sunnie NielsenNatalie Alexander  CC: Right shoulder pain  HPI: This is a pleasant 30 year old male, he does crossfit.  He finished a very intense MudRun, and had over the next few days had moderate to severe pain in the anterior aspect of his right shoulder, worse with internal rotation and reaching overhead. No radiation, no mechanical symptoms, no paresthesias into the hand.  Past medical history:  Negative.  See flowsheet/record as well for more information.  Surgical history: Negative.  See flowsheet/record as well for more information.  Family history: Negative.  See flowsheet/record as well for more information.  Social history: Negative.  See flowsheet/record as well for more information.  Allergies, and medications have been entered into the medical record, reviewed, and no changes needed.   Review of Systems: No headache, visual changes, nausea, vomiting, diarrhea, constipation, dizziness, abdominal pain, skin rash, fevers, chills, night sweats, weight loss, swollen lymph nodes, body aches, joint swelling, muscle aches, chest pain, shortness of breath, mood changes, visual or auditory hallucinations.   Objective:   General: Well Developed, well nourished, and in no acute distress.  Neuro/Psych: Alert and oriented x3, extra-ocular muscles intact, able to move all 4 extremities, sensation grossly intact. Skin: Warm and dry, no rashes noted.  Respiratory: Not using accessory muscles, speaking in full sentences, trachea midline.  Cardiovascular: Pulses palpable, no extremity edema. Abdomen: Does not appear distended. Right Shoulder: Inspection reveals no abnormalities, atrophy or asymmetry. Palpation is normal with no tenderness over AC joint or bicipital groove. ROM is full in all planes. Rotator cuff strength is weak to internal rotation with a positive belly press Mildly positive Neer and Hawkin's tests, empty can. Speeds  and Yergason's tests normal. No labral pathology noted with negative Obrien's, negative crank, negative clunk, and good stability. Normal scapular function observed. No painful arc and no drop arm sign. No apprehension sign  Impression and Recommendations:   This case required medical decision making of moderate complexity.  Strain of subscapularis muscle, right, initial encounter Meloxicam, rehabilitation exercises. Return to see me in one month, if we do need an MRI he prefers that we do it at the beginning of next year. At this point I don't think we need any imaging.

## 2016-05-31 NOTE — Assessment & Plan Note (Signed)
Meloxicam, rehabilitation exercises. Return to see me in one month, if we do need an MRI he prefers that we do it at the beginning of next year. At this point I don't think we need any imaging.

## 2016-06-30 ENCOUNTER — Encounter: Payer: Self-pay | Admitting: Sports Medicine

## 2016-06-30 ENCOUNTER — Ambulatory Visit (INDEPENDENT_AMBULATORY_CARE_PROVIDER_SITE_OTHER): Payer: BLUE CROSS/BLUE SHIELD | Admitting: Sports Medicine

## 2016-06-30 DIAGNOSIS — S46811A Strain of other muscles, fascia and tendons at shoulder and upper arm level, right arm, initial encounter: Secondary | ICD-10-CM | POA: Diagnosis not present

## 2016-06-30 NOTE — Progress Notes (Signed)
  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 It's waking him from sleep, and he has at this point failed 6 weeks of conservative measures.  Past medical history:  Negative.  See flowsheet/record as well for more information.  Surgical history: Negative.  See flowsheet/record as well for more information.  Family history: Negative.  See flowsheet/record as well for more information.  Social history: Negative.  See flowsheet/record as well for more information.  Allergies, and medications have been entered into the medical record, reviewed, and no changes needed.   Review of Systems: No fevers, chills, night sweats, weight loss, chest pain, or shortness of breath.   Objective:    General: Well Developed, well nourished, and in no acute distress.  Neuro: Alert and oriented x3, extra-ocular muscles intact, sensation grossly intact.  HEENT: Normocephalic, atraumatic, pupils equal round reactive to light, neck supple, no masses, no lymphadenopathy, thyroid nonpalpable.  Skin: Warm and dry, no rashes. Cardiac: Regular rate and rhythm, no murmurs rubs or gallops, no lower extremity edema.  Respiratory: Clear to auscultation bilaterally. Not using accessory muscles, speaking in full sentences. Right Shoulder: Inspection reveals no abnormalities, atrophy or asymmetry. Palpation is normal with no tenderness over AC joint or bicipital groove. ROM is full in all planes. Rotator cuff strength normal throughout. Positive Neer and Hawkin's tests, empty can. Speeds and Yergason's tests normal. No labral pathology noted with negative Obrien's, negative crank, negative  clunk, and good stability. Normal scapular function observed. No painful arc and no drop arm sign. No apprehension sign  Impression and Recommendations:    Strain of subscapularis muscle, right, initial encounter Persistent pain, this time referable to both the infraspinatus and subscapularis. Has not really improved significantly after greater than 6 weeks of physician directed and formal physical therapy, NSAIDs. We are going to proceed with MRI, return to see me to go over MRI results.

## 2016-06-30 NOTE — Assessment & Plan Note (Addendum)
Persistent pain, this time referable to both the infraspinatus and subscapularis. Has not really improved significantly after greater than 6 weeks of physician directed and formal physical therapy, NSAIDs. We are going to proceed with MRI, return to see me to go over MRI results. He will also need an x-ray.

## 2016-07-01 ENCOUNTER — Telehealth: Payer: Self-pay

## 2016-07-01 NOTE — Telephone Encounter (Signed)
Patient stated that his number was running the the low 140's he said that he will keep a log of his blood pressure and when he come in on Tuesday he will let us know. Ryan Hubbard,CMA

## 2016-07-01 NOTE — Telephone Encounter (Signed)
Patient called stated that his medication has been switched and he was not aware of it. He stated that he was previously taking Benicar HCTZ and when he got his medication it was just Benicar by itself and he says that he is noticing  A difference on his blood pressure readings. Please advise. Rhonda Cunningham,CMA

## 2016-07-01 NOTE — Telephone Encounter (Signed)
Previous telephone note 08/2015 from Dr. Ivan Hubbard mentioned that Ryan Hubbard was not covered so he advised trying Benicar. Per our records, the patient has been taking what Dr. Ivan Hubbard prescribed which was just the Benicar by itself. His blood pressures have been fine in our office on several occasions since this switch was made, what kind of numbers is he getting at home and has he had home blood pressure cuff verified in the office?   If blood pressure cuff has not been verified in the office in the past year, I would leave the medication as it is for now and have him schedule a nurse visit for home blood pressure monitor verification.

## 2016-07-05 ENCOUNTER — Ambulatory Visit (INDEPENDENT_AMBULATORY_CARE_PROVIDER_SITE_OTHER): Payer: BLUE CROSS/BLUE SHIELD

## 2016-07-05 DIAGNOSIS — M7581 Other shoulder lesions, right shoulder: Secondary | ICD-10-CM

## 2016-07-05 DIAGNOSIS — S46811A Strain of other muscles, fascia and tendons at shoulder and upper arm level, right arm, initial encounter: Secondary | ICD-10-CM

## 2016-07-08 ENCOUNTER — Ambulatory Visit: Payer: BLUE CROSS/BLUE SHIELD | Admitting: Sports Medicine

## 2016-07-14 ENCOUNTER — Encounter: Payer: Self-pay | Admitting: Sports Medicine

## 2016-07-14 ENCOUNTER — Ambulatory Visit (INDEPENDENT_AMBULATORY_CARE_PROVIDER_SITE_OTHER): Payer: BLUE CROSS/BLUE SHIELD | Admitting: Sports Medicine

## 2016-07-14 DIAGNOSIS — M7591 Shoulder lesion, unspecified, right shoulder: Secondary | ICD-10-CM | POA: Diagnosis not present

## 2016-07-14 NOTE — Progress Notes (Signed)
  Subjective:    CC: Right shoulder pain  HPI: This is a pleasant 31 year old male, he has had a fairly long history of right shoulder pain, impingement related, ultimately we obtained an MRI that showed rotator cuff tendinosis. He's been doing well but the pain has simply been hanging on and he is agreeable to try an injection today.  Past medical history:  Negative.  See flowsheet/record as well for more information.  Surgical history: Negative.  See flowsheet/record as well for more information.  Family history: Negative.  See flowsheet/record as well for more information.  Social history: Negative.  See flowsheet/record as well for more information.  Allergies, and medications have been entered into the medical record, reviewed, and no changes needed.   Review of Systems: No fevers, chills, night sweats, weight loss, chest pain, or shortness of breath.   Objective:    General: Well Developed, well nourished, and in no acute distress.  Neuro: Alert and oriented x3, extra-ocular muscles intact, sensation grossly intact.  HEENT: Normocephalic, atraumatic, pupils equal round reactive to light, neck supple, no masses, no lymphadenopathy, thyroid nonpalpable.  Skin: Warm and dry, no rashes. Cardiac: Regular rate and rhythm, no murmurs rubs or gallops, no lower extremity edema.  Respiratory: Clear to auscultation bilaterally. Not using accessory muscles, speaking in full sentences.  Procedure: Real-time Ultrasound Guided Injection of right subacromial bursa Device: GE Logiq E  Verbal informed consent obtained.  Time-out conducted.  Noted no overlying erythema, induration, or other signs of local infection.  Skin prepped in a sterile fashion.  Local anesthesia: Topical Ethyl chloride.  With sterile technique and under real time ultrasound guidance:  1 mL lidocaine, 1 mL Marcaine, 1 mL kenalog 40 injected easily. Completed without difficulty  Pain immediately resolved suggesting accurate  placement of the medication.  Advised to call if fevers/chills, erythema, induration, drainage, or persistent bleeding.  Images permanently stored and available for review in the ultrasound unit.  Impression: Technically successful ultrasound guided injection.  Impression and Recommendations:    Right supraspinatus tendinitis Persistent pain referable to the rotator cuff, MRI showed supraspinatus tendinosis. We are going to proceed with subacromial injection and he will take it easy with working out for the next 4-5 days. He will return to see me in one month to see how things are going, referral for arthroscopy if no better.

## 2016-07-14 NOTE — Assessment & Plan Note (Signed)
Persistent pain referable to the rotator cuff, MRI showed supraspinatus tendinosis. We are going to proceed with subacromial injection and he will take it easy with working out for the next 4-5 days. He will return to see me in one month to see how things are going, referral for arthroscopy if no better.

## 2016-08-16 ENCOUNTER — Ambulatory Visit: Payer: BLUE CROSS/BLUE SHIELD | Admitting: Sports Medicine

## 2016-12-15 LAB — CBC AND DIFFERENTIAL
HCT: 42 (ref 41–53)
HEMOGLOBIN: 14 (ref 13.5–17.5)
Platelets: 277 (ref 150–399)
WBC: 6.9

## 2016-12-15 LAB — BASIC METABOLIC PANEL
BUN: 14 (ref 4–21)
Creatinine: 1.1 (ref 0.6–1.3)
GLUCOSE: 100
POTASSIUM: 4.4 (ref 3.4–5.3)
SODIUM: 140 (ref 137–147)

## 2016-12-15 LAB — HEPATIC FUNCTION PANEL
ALT: 16 (ref 10–40)
AST: 19 (ref 14–40)

## 2016-12-15 LAB — LIPID PANEL
CHOLESTEROL: 154 (ref 0–200)
LDL Cholesterol: 83
LDl/HDL Ratio: 1.4
Triglycerides: 57 (ref 40–160)

## 2016-12-15 LAB — TSH: TSH: 1.12 (ref 0.41–5.90)

## 2016-12-27 ENCOUNTER — Ambulatory Visit (INDEPENDENT_AMBULATORY_CARE_PROVIDER_SITE_OTHER): Payer: BLUE CROSS/BLUE SHIELD | Admitting: Sports Medicine

## 2016-12-27 ENCOUNTER — Ambulatory Visit (INDEPENDENT_AMBULATORY_CARE_PROVIDER_SITE_OTHER): Payer: BLUE CROSS/BLUE SHIELD

## 2016-12-27 DIAGNOSIS — M79645 Pain in left finger(s): Secondary | ICD-10-CM | POA: Diagnosis not present

## 2016-12-27 DIAGNOSIS — M7591 Shoulder lesion, unspecified, right shoulder: Secondary | ICD-10-CM | POA: Diagnosis not present

## 2016-12-27 HISTORY — DX: Pain in left finger(s): M79.645

## 2016-12-27 MED ORDER — DICLOFENAC SODIUM 75 MG PO TBEC: 75.0000 mg | DELAYED_RELEASE_TABLET | Freq: Two times a day (BID) | ORAL | 3 refills | Status: DC

## 2016-12-27 NOTE — Progress Notes (Signed)
   Subjective:    I'm seeing this patient as a consultation for:  Dr. 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 He tells me he had a fracture of his left first metacarpal years ago, for the past several months she's had increasing pain that he localizes at the base of his first metacarpal, moderate gelling, no radiation, moderate, persistent.  Right shoulder pain: Did extremity well with a subacromial injection, does have some persistent pain anteriorly and over the deltoid, worse with mostly internal rotation. His injection lasted about a month and the pain came back, he has been doing rehabilitation exercises but when he demonstrated them for me, none of them were for the subscapularis.  Past medical history:  Negative.  See flowsheet/record as well for more information.  Surgical history: Negative.  See flowsheet/record as well for more information.  Family history: Negative.  See flowsheet/record as well for more information.  Social history: Negative.  See flowsheet/record as well for more information.  Allergies, and medications have been entered into the medical record, reviewed, and no changes needed.   Review of Systems: No headache, visual changes, nausea, vomiting, diarrhea, constipation, dizziness, abdominal pain, skin rash, fevers, chills, night sweats, weight loss, swollen lymph nodes, body aches, joint swelling, muscle aches, chest pain, shortness of breath, mood changes, visual or auditory hallucinations.   Objective:   General: Well Developed, well nourished, and in no acute distress.  Neuro/Psych: Alert and oriented x3, extra-ocular muscles intact, able to move all 4 extremities, sensation grossly intact. Skin: Warm and dry, no rashes noted.  Respiratory: Not using accessory muscles, speaking in full sentences, trachea midline.  Cardiovascular: Pulses palpable, no extremity edema. Abdomen: Does not  appear distended. Right Shoulder: Inspection reveals no abnormalities, atrophy or asymmetry. Palpation is normal with no tenderness over AC joint or bicipital groove. ROM is full in all planes. Good rotator cuff strength throughout, does have reproduction of pain with belly press and liftoff test. No signs of impingement with negative Neer and Hawkin's tests, empty can. Speeds and Yergason's tests normal. No labral pathology noted with negative Obrien's, negative crank, negative clunk, and good stability. Normal scapular function observed. No painful arc and no drop arm sign. No apprehension sign Left Wrist: Inspection normal with no visible erythema or swelling. ROM smooth and normal with good flexion and extension and ulnar/radial deviation that is symmetrical with opposite wrist. Tender to palpation at the trapeziometacarpal joint No snuffbox tenderness. No tenderness over Canal of [place]. Strength 5/5 in all directions without pain. Negative tinel's and phalens signs. Negative Finkelstein sign. Negative Watson's test.  X-rays personally reviewed, there is a bit of loss of joint space at the trapeziometacarpal joint without subchondral sclerosis, osteophytes or subchondral cystic change.  Impression and Recommendations:   This case required medical decision making of moderate complexity.  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 X-rays, thumb spica brace, return in 2 weeks, injection if no better.  Right supraspinatus tendinitis Pain is referable more to the subscapularis, he's been doing all the rotator cuff exercises except internal rotation, we did discuss liftoff type rehabilitation exercises, he will do these and then we can discuss his shoulder further in the future.

## 2016-12-27 NOTE — Assessment & Plan Note (Signed)
Switching to Voltaren, suspect thumb basal joint arthralgia. X-rays, thumb spica brace, return in 2 weeks, injection if no better.

## 2016-12-27 NOTE — Assessment & Plan Note (Signed)
Pain is referable more to the subscapularis, he's been doing all the rotator cuff exercises except internal rotation, we did discuss liftoff type rehabilitation exercises, he will do these and then we can discuss his shoulder further in the future.

## 2017-01-10 ENCOUNTER — Encounter: Payer: Self-pay | Admitting: Sports Medicine

## 2017-01-10 ENCOUNTER — Ambulatory Visit (INDEPENDENT_AMBULATORY_CARE_PROVIDER_SITE_OTHER): Payer: BLUE CROSS/BLUE SHIELD | Admitting: Sports Medicine

## 2017-01-10 DIAGNOSIS — M79645 Pain in left finger(s): Secondary | ICD-10-CM

## 2017-01-10 DIAGNOSIS — M7591 Shoulder lesion, unspecified, right shoulder: Secondary | ICD-10-CM | POA: Diagnosis not present

## 2017-01-10 MED ORDER — DICLOFENAC SODIUM 2 % TD SOLN: 2.0000 | Freq: Two times a day (BID) | TRANSDERMAL | 11 refills | Status: DC

## 2017-01-10 NOTE — Assessment & Plan Note (Signed)
Improving significantly now that he's added subscapularis rehabilitation exercises.

## 2017-01-10 NOTE — Assessment & Plan Note (Signed)
Consistent with thumb basal joint arthralgia, improving. Adding topical 2% diclofenac samples, if persistent pain we will consider injection.  He does have gelling that is consistent with osteoarthritis.

## 2017-01-10 NOTE — Progress Notes (Signed)
  Subjective:    CC: Follow-up  HPI: Left hand pain: Improved significantly with anti-inflammatories, short-term thumb spica bracing.  Right shoulder pain: Has just now started subscapularis rehabilitation exercises, symptoms previously  clinically resemble more of a subscapularis/subcoracoid impingement process. He has noted fantastic improvements with adding subscapularis rehabilitation exercises to his regimen.  Past medical history:  Negative.  See flowsheet/record as well for more information.  Surgical history: Negative.  See flowsheet/record as well for more information.  Family history: Negative.  See flowsheet/record as well for more information.  Social history: Negative.  See flowsheet/record as well for more information.  Allergies, and medications have been entered into the medical record, reviewed, and no changes needed.   Review of Systems: No fevers, chills, night sweats, weight loss, chest pain, or shortness of breath.   Objective:    General: Well Developed, well nourished, and in no acute distress.  Neuro: Alert and oriented x3, extra-ocular muscles intact, sensation grossly intact.  HEENT: Normocephalic, atraumatic, pupils equal round reactive to light, neck supple, no masses, no lymphadenopathy, thyroid nonpalpable.  Skin: Warm and dry, no rashes. Cardiac: Regular rate and rhythm, no murmurs rubs or gallops, no lower extremity edema.  Respiratory: Clear to auscultation bilaterally. Not using accessory muscles, speaking in full sentences.  Impression and Recommendations:    Right supraspinatus tendinitis Improving significantly now that he's added subscapularis rehabilitation exercises.  Thumb pain, left Consistent with thumb basal joint arthralgia, improving. Adding topical 2% diclofenac samples, if persistent pain we will consider injection.  He does have gelling that is consistent with osteoarthritis.  I spent 25 minutes with this patient, greater than 50%  was face-to-face time counseling regarding the above diagnoses

## 2017-03-23 ENCOUNTER — Other Ambulatory Visit: Payer: Self-pay | Admitting: Osteopathic Medicine

## 2017-03-23 DIAGNOSIS — I10 Essential (primary) hypertension: Secondary | ICD-10-CM

## 2017-04-11 ENCOUNTER — Telehealth: Payer: Self-pay

## 2017-04-11 DIAGNOSIS — I10 Essential (primary) hypertension: Secondary | ICD-10-CM

## 2017-04-11 MED ORDER — OLMESARTAN MEDOXOMIL 40 MG PO TABS: 40.0000 mg | ORAL_TABLET | Freq: Every day | ORAL | 0 refills | Status: DC

## 2017-04-11 NOTE — Telephone Encounter (Signed)
PATIENT REQUEST REFILL FOR BENICAR. PATIENT WAS SCHEDULED to come in on 04/18/2017 for a CPE. Rhonda Cunningham,CMA

## 2017-04-18 ENCOUNTER — Encounter: Payer: Self-pay | Admitting: Osteopathic Medicine

## 2017-04-18 ENCOUNTER — Ambulatory Visit (INDEPENDENT_AMBULATORY_CARE_PROVIDER_SITE_OTHER): Payer: BLUE CROSS/BLUE SHIELD | Admitting: Osteopathic Medicine

## 2017-04-18 VITALS — BP 130/87 | HR 87 | Ht 72.0 in | Wt 199.0 lb

## 2017-04-18 DIAGNOSIS — I10 Essential (primary) hypertension: Secondary | ICD-10-CM

## 2017-04-18 DIAGNOSIS — Z0000 Encounter for general adult medical examination without abnormal findings: Secondary | ICD-10-CM | POA: Diagnosis not present

## 2017-04-18 DIAGNOSIS — R7301 Impaired fasting glucose: Secondary | ICD-10-CM | POA: Insufficient documentation

## 2017-04-18 LAB — POCT GLYCOSYLATED HEMOGLOBIN (HGB A1C): Hemoglobin A1C: 5.9

## 2017-04-18 MED ORDER — OLMESARTAN MEDOXOMIL 40 MG PO TABS: 40.0000 mg | ORAL_TABLET | Freq: Every day | ORAL | 3 refills | Status: DC

## 2017-04-18 NOTE — Progress Notes (Signed)
HPI: Ryan Hubbard is a 31 y.o. male  who presents to [facility] today, 04/18/17,  for chief complaint of:  Chief Complaint  Patient presents with  . Annual Exam    Annual exam: See below for review of preventive care  Hypertension: Stable on Benicar as below, needs updated labs. Strong family history of hypertension and heart problems as well as diabetes. BP controlled - numbers at <130/80.   Labs reviewed: 12/15/16 Glc 100 - (+)FH DM2, will screen today with A1C  BUN 14 Cr 1.05 Liver enz ok TC 154 LDL 83 HDL 60 TSH nml  Past medical, surgical, social and family history reviewed: Past Medical History:  Diagnosis Date  . Essential hypertension, benign 07/08/2013   No past surgical history on file. Social History  Substance Use Topics  . Smoking status: Never Smoker  . Smokeless tobacco: Never Used  . Alcohol use 1.2 oz/week    2 Shots of liquor per week   Family History  Problem Relation Age of Onset  . Cancer Mother   . Diabetes Mother      Current medication list and allergy/intolerance information reviewed:   Current Outpatient Prescriptions  Medication Sig Dispense Refill  . olmesartan (BENICAR) 40 MG tablet Take 1 tablet (40 mg total) by mouth daily. 90 tablet 3   No current facility-administered medications for this visit.    Allergies  Allergen Reactions  . Atrovent [Ipratropium]     Throat pain      Review of Systems:  Constitutional:  No  fever, no chills, No recent illness   HEENT: No  headache, no vision change, no hearing change, No sore throat, No  sinus pressure  Cardiac: No  chest pain, No  pressure  Respiratory:  No  shortness of breath. No  Cough  Gastrointestinal: No  abdominal pain, No  nausea, No  vomiting  Musculoskeletal: No new myalgia/arthralgia  Skin: No  Rash, No other wounds/concerning lesions  Hem/Onc: No  easy bruising/bleeding, No  abnormal lymph node  Endocrine: No cold  intolerance,  No heat intolerance  Neurologic: No  weakness, No  dizziness  Psychiatric: No  concerns with depression, No  concerns with anxiety  Exam:  BP 130/87   Pulse 87   Ht 6' (1.829 m)   Wt 199 lb (90.3 kg)   BMI 26.99 kg/m   Constitutional: VS see above. General Appearance: alert, well-developed, well-nourished, NAD  Eyes: Normal lids and conjunctive, non-icteric sclera  Ears, Nose, Mouth, Throat: MMM, Normal external inspection ears/nares/mouth/lips/gums. TM normal bilaterally. Pharynx/tonsils no erythema, no exudate. Nasal mucosa normal.   Neck: No masses, trachea midline. No thyroid enlargement. No tenderness/mass appreciated. No lymphadenopathy  Respiratory: Normal respiratory effort. no wheeze, no rhonchi, no rales  Cardiovascular: S1/S2 normal, no murmur, no rub/gallop auscultated. RRR. No lower extremity edema.   Gastrointestinal: Nontender, no masses. No hepatomegaly, no splenomegaly. No hernia appreciated. Bowel sounds normal. Rectal exam deferred.   Musculoskeletal: Gait normal. No clubbing/cyanosis of digits.   Neurological:Normal balance/coordination. No tremor.   Skin: warm, dry, intact. No rash/ulcer.   Psychiatric: Normal judgment/insight. Normal mood and affect. Oriented x3.    Results for orders placed or performed in visit on 04/18/17 (from the past 24 hour(s))  POCT HgB A1C     Status: None   Collection Time: 04/18/17  7:32 AM  Result Value Ref Range   Hemoglobin A1C 5.9      ASSESSMENT/PLAN:   Annual physical exam - See  below for review of preventive care. No major concerns.  Essential hypertension, benign - Plan: olmesartan (BENICAR) 40 MG tablet  Elevated fasting glucose - First time elevation, A1c 5.9. We'll follow in 3 months - Plan: Hemoglobin A1c, POCT HgB A1C   MALE PREVENTIVE CARE updated 04/18/17  ANNUAL SCREENING/COUNSELING  Any changes to health in the past year? no  Tobacco - no   Alcohol - social  drinker  Diet/Exercise - Healthy habits discussed to decrease CV risk and promote overall health. Patient does not have dietary restrictions.   Depression - PQH2 Negative  Feel safe at home? - no  HTN SCREENING - SEE VITALS  SEXUAL/REPRODUCTIVE HEALTH  Sexually active? - Yes with male.  STI testing needed/desired today? - no  INFECTIOUS DISEASE SCREENING  HIV - needs  GC/CT - does not need  HepC - does not need  TB - does not need  CANCER SCREENING  Lung - does not need  Colon - does not need - no FH except polyps in grandfather   Prostate - does not need - no FH   OTHER DISEASE SCREENING  Lipid - good  DM2 - needs  Osteoporosis - does not need  ADULT VACCINATION  Influenza - needs today, annual vaccine recommended  Td - already has  Zoster - was not indicated  Pneumonia - was not indicated   Visit summary with medication list and pertinent instructions was printed for patient to review. All questions at time of visit were answered - patient instructed to contact office with any additional concerns. ER/RTC precautions were reviewed with the patient. Follow-up plan: Return in about 3 months (around 07/19/2017) for recheck sugars. Plan for annual physical one year, sooner if needed .

## 2017-04-18 NOTE — Patient Instructions (Addendum)
Plan:  Try antacid medications daily use for 6-8 weeks or so, then stop - this gives the linig of the stomach a chance to heal  Flu shot recommended annually for everyone!   Labs look good!  As long as blood pressure consistently less than 130/80, no need to change medicines - if it's creeping up, come see me   See you in a year, unless you need me sooner!   Influenza Virus Vaccine injection What is this medicine? INFLUENZA VIRUS VACCINE (in floo EN zuh VAHY ruhs vak SEEN) helps to reduce the risk of getting influenza also known as the flu. The vaccine only helps protect you against some strains of the flu. This medicine may be used for other purposes; ask your health care provider or pharmacist if you have questions. COMMON BRAND NAME(S): Afluria, Agriflu, Alfuria, FLUAD, Fluarix, Fluarix Quadrivalent, Flublok, Flublok Quadrivalent, FLUCELVAX, Flulaval, Fluvirin, Fluzone, Fluzone High-Dose, Fluzone Intradermal What should I tell my health care provider before I take this medicine? They need to know if you have any of these conditions: -bleeding disorder like hemophilia -fever or infection -Guillain-Barre syndrome or other neurological problems -immune system problems -infection with the human immunodeficiency virus (HIV) or AIDS -low blood platelet counts -multiple sclerosis -an unusual or allergic reaction to influenza virus vaccine, latex, other medicines, foods, dyes, or preservatives. Different brands of vaccines contain different allergens. Some may contain latex or eggs. Talk to your doctor about your allergies to make sure that you get the right vaccine. -pregnant or trying to get pregnant -breast-feeding How should I use this medicine? This vaccine is for injection into a muscle or under the skin. It is given by a health care professional. A copy of Vaccine Information Statements will be given before each vaccination. Read this sheet carefully each time. The sheet may change  frequently. Talk to your healthcare provider to see which vaccines are right for you. Some vaccines should not be used in all age groups. Overdosage: If you think you have taken too much of this medicine contact a poison control center or emergency room at once. NOTE: This medicine is only for you. Do not share this medicine with others. What if I miss a dose? This does not apply. What may interact with this medicine? -chemotherapy or radiation therapy -medicines that lower your immune system like etanercept, anakinra, infliximab, and adalimumab -medicines that treat or prevent blood clots like warfarin -phenytoin -steroid medicines like prednisone or cortisone -theophylline -vaccines This list may not describe all possible interactions. Give your health care provider a list of all the medicines, herbs, non-prescription drugs, or dietary supplements you use. Also tell them if you smoke, drink alcohol, or use illegal drugs. Some items may interact with your medicine. What should I watch for while using this medicine? Report any side effects that do not go away within 3 days to your doctor or health care professional. Call your health care provider if any unusual symptoms occur within 6 weeks of receiving this vaccine. You may still catch the flu, but the illness is not usually as bad. You cannot get the flu from the vaccine. The vaccine will not protect against colds or other illnesses that may cause fever. The vaccine is needed every year. What side effects may I notice from receiving this medicine? Side effects that you should report to your doctor or health care professional as soon as possible: -allergic reactions like skin rash, itching or hives, swelling of the face, lips, or tongue  Side effects that usually do not require medical attention (report to your doctor or health care professional if they continue or are bothersome): -fever -headache -muscle aches and pains -pain, tenderness,  redness, or swelling at the injection site -tiredness This list may not describe all possible side effects. Call your doctor for medical advice about side effects. You may report side effects to FDA at 1-800-FDA-1088. Where should I keep my medicine? The vaccine will be given by a health care professional in a clinic, pharmacy, doctor's office, or other health care setting. You will not be given vaccine doses to store at home. NOTE: This sheet is a summary. It may not cover all possible information. If you have questions about this medicine, talk to your doctor, pharmacist, or health care provider.  2018 Elsevier/Gold Standard (2015-01-09 10:07:28)

## 2017-04-19 LAB — HEMOGLOBIN A1C
EAG (MMOL/L): 6.2 (calc)
Hgb A1c MFr Bld: 5.5 % of total Hgb (ref <5.7)
MEAN PLASMA GLUCOSE: 111 (calc)

## 2017-04-21 ENCOUNTER — Encounter: Payer: Self-pay | Admitting: Osteopathic Medicine

## 2017-04-21 LAB — CALCIUM
ALBUMIN: 4.9
CALCIUM: 10
GLOBULIN, TOTAL: 2.3
Protein: 7.2

## 2017-05-08 ENCOUNTER — Other Ambulatory Visit: Payer: Self-pay | Admitting: Osteopathic Medicine

## 2017-05-08 DIAGNOSIS — I10 Essential (primary) hypertension: Secondary | ICD-10-CM

## 2017-07-17 ENCOUNTER — Telehealth: Payer: Self-pay | Admitting: Osteopathic Medicine

## 2017-07-17 DIAGNOSIS — Z1329 Encounter for screening for other suspected endocrine disorder: Secondary | ICD-10-CM

## 2017-07-17 DIAGNOSIS — I10 Essential (primary) hypertension: Secondary | ICD-10-CM

## 2017-07-17 DIAGNOSIS — Z0000 Encounter for general adult medical examination without abnormal findings: Secondary | ICD-10-CM

## 2017-07-17 DIAGNOSIS — R7301 Impaired fasting glucose: Secondary | ICD-10-CM

## 2017-07-17 DIAGNOSIS — Z13220 Encounter for screening for lipoid disorders: Secondary | ICD-10-CM

## 2017-07-17 NOTE — Telephone Encounter (Signed)
Pt called. He has an appointment on 1/17 and he wants to come in to have labs drawn on before visit.

## 2017-07-17 NOTE — Telephone Encounter (Signed)
Labs ordered  Pt advised

## 2017-07-19 ENCOUNTER — Ambulatory Visit: Payer: BLUE CROSS/BLUE SHIELD | Admitting: Osteopathic Medicine

## 2017-07-20 ENCOUNTER — Encounter: Payer: Self-pay | Admitting: Osteopathic Medicine

## 2017-07-20 ENCOUNTER — Ambulatory Visit: Payer: BLUE CROSS/BLUE SHIELD | Admitting: Osteopathic Medicine

## 2017-07-20 VITALS — BP 138/60 | HR 88 | Temp 98.2°F | Wt 200.0 lb

## 2017-07-20 DIAGNOSIS — R7301 Impaired fasting glucose: Secondary | ICD-10-CM | POA: Diagnosis not present

## 2017-07-20 DIAGNOSIS — I10 Essential (primary) hypertension: Secondary | ICD-10-CM

## 2017-07-20 NOTE — Progress Notes (Signed)
HPI: Ryan Hubbard is a 32 y.o. male who  has a past medical history of Essential hypertension, benign (07/08/2013).  he presents to [facility] today, 07/20/17,  for chief complaint of:  Chief Complaint  Patient presents with  . Labs Only    Hypertension: Well-controlled on current medications, no home blood pressures to report.  Recent elevation in fasting blood glucose prompted point care A1c in prediabetic range, we confirmed this with serum blood draw and it was normal. Patient wanted this to be rechecked. Unfortunately, incorrect lab orders were drawn, we'll see if we can get this ordered for him today based on the blood draw party completed. Otherwise, no concerns on labs. Patient is feeling well today. Fatigue is doing a bit better, he has stopped working out as intensely as he was, still going to the gym consistently about 3 days per week.  Past medical, surgical, social and family history reviewed:  Patient Active Problem List   Diagnosis Date Noted  . Elevated fasting glucose 04/18/2017  . Thumb pain, left 12/27/2016  . Right supraspinatus tendinitis 05/31/2016  . Sinusitis 04/10/2015  . Seasonal allergies 11/20/2013  . Essential hypertension, benign 07/08/2013  . Ganglion cyst 05/28/2013  . Abnormal glucose 11/03/2010    No past surgical history on file.  Social History   Tobacco Use  . Smoking status: Never Smoker  . Smokeless tobacco: Never Used  Substance Use Topics  . Alcohol use: Yes    Alcohol/week: 1.2 oz    Types: 2 Shots of liquor per week    Family History  Problem Relation Age of Onset  . Cancer Mother   . Diabetes Mother      Current medication list and allergy/intolerance information reviewed:    Current Outpatient Medications  Medication Sig Dispense Refill  . olmesartan (BENICAR) 40 MG tablet Take 1 tablet (40 mg total) by mouth daily. 90 tablet 3   No current facility-administered medications for this  visit.     Allergies  Allergen Reactions  . Atrovent [Ipratropium]     Throat pain      Review of Systems:  Constitutional: No recent illness.   HEENT: No  headache  Cardiac: No  chest pain,  Respiratory:  No  shortness of breath.   Musculoskeletal: No new myalgia/arthralgia   Exam:  BP 138/60   Pulse 88   Temp 98.2 F (36.8 C) (Oral)   Wt 200 lb 0.6 oz (90.7 kg)   BMI 27.13 kg/m   Constitutional: VS see above. General Appearance: alert, well-developed, well-nourished, NAD  Ears, Nose, Mouth, Throat: MMM, Normal external inspection ears/nares/mouth/lips/gums.   Neck: No masses, trachea midline.   Respiratory: Normal respiratory effort.  Musculoskeletal: Gait normal.  Neurological: Normal balance/coordination.     Psychiatric: Normal judgment/insight. Normal mood and affect. Oriented x3.    Results for orders placed or performed in visit on 07/17/17 (from the past 72 hour(s))  CBC with Differential     Status: None   Collection Time: 07/19/17  7:36 AM  Result Value Ref Range   WBC 6.6 3.8 - 10.8 Thousand/uL   RBC 4.31 4.20 - 5.80 Million/uL   Hemoglobin 13.9 13.2 - 17.1 g/dL   HCT 29.5 28.4 - 13.2 %   MCV 93.0 80.0 - 100.0 fL   MCH 32.3 27.0 - 33.0 pg   MCHC 34.7 32.0 - 36.0 g/dL   RDW 44.0 10.2 - 72.5 %   Platelets 268 140 - 400 Thousand/uL  MPV 11.3 7.5 - 12.5 fL   Neutro Abs 2,818 1,500 - 7,800 cells/uL   Lymphs Abs 3,142 850 - 3,900 cells/uL   WBC mixed population 541 200 - 950 cells/uL   Eosinophils Absolute 79 15 - 500 cells/uL   Basophils Absolute 20 0 - 200 cells/uL   Neutrophils Relative % 42.7 %   Total Lymphocyte 47.6 %   Monocytes Relative 8.2 %   Eosinophils Relative 1.2 %   Basophils Relative 0.3 %  COMPLETE METABOLIC PANEL WITH GFR     Status: None   Collection Time: 07/19/17  7:36 AM  Result Value Ref Range   Glucose, Bld 94 65 - 99 mg/dL    Comment: .            Fasting reference interval .    BUN 14 7 - 25 mg/dL   Creat  4.091.12 8.110.60 - 9.141.35 mg/dL   GFR, Est Non African American 87 > OR = 60 mL/min/1.5273m2   GFR, Est African American 101 > OR = 60 mL/min/1.3873m2   BUN/Creatinine Ratio NOT APPLICABLE 6 - 22 (calc)   Sodium 137 135 - 146 mmol/L   Potassium 4.3 3.5 - 5.3 mmol/L   Chloride 102 98 - 110 mmol/L   CO2 28 20 - 32 mmol/L   Calcium 9.6 8.6 - 10.3 mg/dL   Total Protein 6.7 6.1 - 8.1 g/dL   Albumin 4.7 3.6 - 5.1 g/dL   Globulin 2.0 1.9 - 3.7 g/dL (calc)   AG Ratio 2.4 1.0 - 2.5 (calc)   Total Bilirubin 0.6 0.2 - 1.2 mg/dL   Alkaline phosphatase (APISO) 65 40 - 115 U/L   AST 15 10 - 40 U/L   ALT 15 9 - 46 U/L  TSH     Status: None   Collection Time: 07/19/17  7:36 AM  Result Value Ref Range   TSH 0.80 0.40 - 4.50 mIU/L  Lipid Profile     Status: None   Collection Time: 07/19/17  7:36 AM  Result Value Ref Range   Cholesterol 151 <200 mg/dL   HDL 54 >78>40 mg/dL   Triglycerides 82 <295<150 mg/dL   LDL Cholesterol (Calc) 81 mg/dL (calc)    Comment: Reference range: <100 . Desirable range <100 mg/dL for primary prevention;   <70 mg/dL for patients with CHD or diabetic patients  with > or = 2 CHD risk factors. Marland Kitchen. LDL-C is now calculated using the Martin-Hopkins  calculation, which is a validated novel method providing  better accuracy than the Friedewald equation in the  estimation of LDL-C.  Horald PollenMartin SS et al. Lenox AhrJAMA. 6213;086(572013;310(19): 2061-2068  (http://education.[facility].com/faq/FAQ164)    Total CHOL/HDL Ratio 2.8 <5.0 (calc)   Non-HDL Cholesterol (Calc) 97 <846<130 mg/dL (calc)    Comment: For patients with diabetes plus 1 major ASCVD risk  factor, treating to a non-HDL-C goal of <100 mg/dL  (LDL-C of <96<70 mg/dL) is considered a therapeutic  option.       ASSESSMENT/PLAN: The primary encounter diagnosis was Elevated fasting glucose. A diagnosis of Essential hypertension, benign was also pertinent to this visit. Will get labs added on and if any further follow-up needed, will arrange  this.    Visit summary with medication list and pertinent instructions was printed for patient to review. All questions at time of visit were answered - patient instructed to contact office with any additional concerns. ER/RTC precautions were reviewed with the patient.   Follow-up plan: Return for annual in 04/2018, sooner if needed .  Please note: voice recognition software was used to produce this document, and typos may escape review. Please contact Dr. Sheppard Coil for any needed clarifications.

## 2017-07-21 LAB — CBC WITH DIFFERENTIAL/PLATELET
BASOS PCT: 0.3 %
Basophils Absolute: 20 cells/uL (ref 0–200)
EOS PCT: 1.2 %
Eosinophils Absolute: 79 cells/uL (ref 15–500)
HCT: 40.1 % (ref 38.5–50.0)
HEMOGLOBIN: 13.9 g/dL (ref 13.2–17.1)
Lymphs Abs: 3142 cells/uL (ref 850–3900)
MCH: 32.3 pg (ref 27.0–33.0)
MCHC: 34.7 g/dL (ref 32.0–36.0)
MCV: 93 fL (ref 80.0–100.0)
MONOS PCT: 8.2 %
MPV: 11.3 fL (ref 7.5–12.5)
NEUTROS ABS: 2818 {cells}/uL (ref 1500–7800)
Neutrophils Relative %: 42.7 %
Platelets: 268 10*3/uL (ref 140–400)
RBC: 4.31 10*6/uL (ref 4.20–5.80)
RDW: 12.1 % (ref 11.0–15.0)
Total Lymphocyte: 47.6 %
WBC mixed population: 541 cells/uL (ref 200–950)
WBC: 6.6 10*3/uL (ref 3.8–10.8)

## 2017-07-21 LAB — LIPID PANEL
Cholesterol: 151 mg/dL (ref <200)
HDL: 54 mg/dL (ref >40)
LDL CHOLESTEROL (CALC): 81 mg/dL
Non-HDL Cholesterol (Calc): 97 mg/dL (calc) (ref <130)
TRIGLYCERIDES: 82 mg/dL (ref <150)
Total CHOL/HDL Ratio: 2.8 (calc) (ref <5.0)

## 2017-07-21 LAB — COMPLETE METABOLIC PANEL WITH GFR
AG Ratio: 2.4 (calc) (ref 1.0–2.5)
ALBUMIN MSPROF: 4.7 g/dL (ref 3.6–5.1)
ALKALINE PHOSPHATASE (APISO): 65 U/L (ref 40–115)
ALT: 15 U/L (ref 9–46)
AST: 15 U/L (ref 10–40)
BUN: 14 mg/dL (ref 7–25)
CO2: 28 mmol/L (ref 20–32)
CREATININE: 1.12 mg/dL (ref 0.60–1.35)
Calcium: 9.6 mg/dL (ref 8.6–10.3)
Chloride: 102 mmol/L (ref 98–110)
GFR, Est African American: 101 mL/min/{1.73_m2} (ref >=60)
GFR, Est Non African American: 87 mL/min/{1.73_m2} (ref >=60)
GLUCOSE: 94 mg/dL (ref 65–99)
Globulin: 2 g/dL (calc) (ref 1.9–3.7)
Potassium: 4.3 mmol/L (ref 3.5–5.3)
Sodium: 137 mmol/L (ref 135–146)
TOTAL PROTEIN: 6.7 g/dL (ref 6.1–8.1)
Total Bilirubin: 0.6 mg/dL (ref 0.2–1.2)

## 2017-07-21 LAB — TSH: TSH: 0.8 m[IU]/L (ref 0.40–4.50)

## 2017-07-21 LAB — HEMOGLOBIN A1C W/OUT EAG: Hgb A1c MFr Bld: 5.9 % of total Hgb — ABNORMAL HIGH (ref <5.7)

## 2017-11-06 ENCOUNTER — Other Ambulatory Visit: Payer: Self-pay | Admitting: Osteopathic Medicine

## 2017-11-06 DIAGNOSIS — I10 Essential (primary) hypertension: Secondary | ICD-10-CM

## 2017-12-22 LAB — LIPID PANEL
Cholesterol: 155 (ref 0–200)
HDL: 57 (ref 35–70)
LDL Cholesterol: 81
Triglycerides: 84 (ref 40–160)

## 2017-12-22 LAB — BASIC METABOLIC PANEL
Creatinine: 1 (ref 0.6–1.3)
Glucose: 98

## 2017-12-22 LAB — HEPATIC FUNCTION PANEL
ALT: 23 (ref 10–40)
AST: 22 (ref 14–40)

## 2017-12-22 LAB — URIC ACID: Uric Acid: 5.1

## 2018-08-15 ENCOUNTER — Encounter: Payer: Self-pay | Admitting: Osteopathic Medicine

## 2018-08-15 ENCOUNTER — Ambulatory Visit (INDEPENDENT_AMBULATORY_CARE_PROVIDER_SITE_OTHER): Payer: BLUE CROSS/BLUE SHIELD | Admitting: Osteopathic Medicine

## 2018-08-15 VITALS — BP 131/75 | HR 72 | Temp 98.4°F | Wt 200.2 lb

## 2018-08-15 DIAGNOSIS — Z23 Encounter for immunization: Secondary | ICD-10-CM

## 2018-08-15 DIAGNOSIS — Z0000 Encounter for general adult medical examination without abnormal findings: Secondary | ICD-10-CM | POA: Diagnosis not present

## 2018-08-15 DIAGNOSIS — R7301 Impaired fasting glucose: Secondary | ICD-10-CM | POA: Diagnosis not present

## 2018-08-15 DIAGNOSIS — I10 Essential (primary) hypertension: Secondary | ICD-10-CM | POA: Diagnosis not present

## 2018-08-15 MED ORDER — OLMESARTAN MEDOXOMIL 40 MG PO TABS: 40.0000 mg | ORAL_TABLET | Freq: Every day | ORAL | 3 refills | Status: DC

## 2018-08-15 NOTE — Patient Instructions (Addendum)
General Preventive Care  Most recent routine screening lipids/other labs: no concerns on labs from June 2019.   We should monitor sugars with A1C test every 6 months or so since this was in the prediabetic range in the past.   Tobacco: don't!   Alcohol: responsible moderation is ok for most adults - if you have concerns about your alcohol intake, please talk to me!   Exercise: to reduce risk of cardiovascular disease and diabetes. Strength training will also prevent osteoporosis.   Mental health: if need for mental health care (medicines, counseling, other), or concerns about moods, please let me know!   Sexual health: if need for STD testing, or if concerns with libido/pain problems, please let me know!  Advanced Directive: Living Will and/or Healthcare Power of Attorney recommended for all adults, regardless of age or health. See attached info.  Vaccines  Flu vaccine: recommended for almost everyone, every fall. Thanks for getting yours today!   Shingles vaccine: Shingrix recommended after age 62.  Pneumonia vaccines: Prevnar and Pneumovax recommended after age 24.  Tetanus booster: Tdap recommended every 10 years. Due 09/2022 Cancer screenings   Colon cancer screening: recommended for everyone at age 3.  Prostate cancer screening: PSA blood test around age 92  Lung cancer screening: not needed for non-smokers  Infection screenings . HIV, Gonorrhea/Chlamydia: screening as needed.  . Hepatitis C: recommended for anyone born 96-1965 . TB: certain at-risk populations, or depending on work requirements and/or travel history Other . Bone Density Test: recommended for men at age 53, sooner depending on risk factors

## 2018-08-15 NOTE — Progress Notes (Signed)
HPI: Ryan Hubbard is a 33 y.o. male who  has a past medical history of Essential hypertension, benign (07/08/2013).  he presents to [facility] today, 08/15/18,  for chief complaint of: Annual physical     Patient here for annual physical / wellness exam.  See preventive care reviewed as below.  Recent labs reviewed in detail with the patient. 12/2017 - no concerns, no A1C done.   Additional concerns today include:  None   Past medical, surgical, social and family history reviewed:  Patient Active Problem List   Diagnosis Date Noted  . Elevated fasting glucose 04/18/2017  . Thumb pain, left 12/27/2016  . Right supraspinatus tendinitis 05/31/2016  . Sinusitis 04/10/2015  . Seasonal allergies 11/20/2013  . Essential hypertension, benign 07/08/2013  . Ganglion cyst 05/28/2013  . Abnormal glucose 11/03/2010    No past surgical history on file.  Social History   Tobacco Use  . Smoking status: Never Smoker  . Smokeless tobacco: Never Used  Substance Use Topics  . Alcohol use: Yes    Alcohol/week: 2.0 standard drinks    Types: 2 Shots of liquor per week    Family History  Problem Relation Age of Onset  . Cancer Mother   . Diabetes Mother      Current medication list and allergy/intolerance information reviewed:    Current Outpatient Medications  Medication Sig Dispense Refill  . olmesartan (BENICAR) 40 MG tablet Take 1 tablet (40 mg total) by mouth daily. 90 tablet 3   No current facility-administered medications for this visit.     Allergies  Allergen Reactions  . Atrovent [Ipratropium]     Throat pain      Review of Systems:  Constitutional:  No  fever, no chills, No recent illness, No unintentional weight changes. No significant fatigue.   HEENT: No  headache, no vision change, no hearing change, No sore throat, No  sinus pressure  Cardiac: No  chest pain, No  pressure, No palpitations, No  Orthopnea  Respiratory:   No  shortness of breath. No  Cough  Gastrointestinal: No  abdominal pain, No  nausea, No  vomiting,  No  blood in stool, No  diarrhea, No  constipation   Musculoskeletal: No new myalgia/arthralgia  Skin: No  Rash, No other wounds/concerning lesions  Genitourinary: No  incontinence, No  abnormal genital bleeding, No abnormal genital discharge  Hem/Onc: No  easy bruising/bleeding  Endocrine: No cold intolerance,  No heat intolerance  Neurologic: No  weakness, No  dizziness  Psychiatric: No  concerns with depression, No  concerns with anxiety, No sleep problems, No mood problems  Exam:  BP 131/75 (BP Location: Left Arm, Patient Position: Sitting, Cuff Size: Normal)   Pulse 72   Temp 98.4 F (36.9 C) (Oral)   Wt 200 lb 3.2 oz (90.8 kg)   BMI 27.15 kg/m   Constitutional: VS see above. General Appearance: alert, well-developed, well-nourished, NAD  Eyes: Normal lids and conjunctive, non-icteric sclera  Ears, Nose, Mouth, Throat: MMM, Normal external inspection ears/nares/mouth/lips/gums. TM normal bilaterally. Pharynx/tonsils no erythema, no exudate. Nasal mucosa normal.   Neck: No masses, trachea midline. No thyroid enlargement. No tenderness/mass appreciated. No lymphadenopathy  Respiratory: Normal respiratory effort. no wheeze, no rhonchi, no rales  Cardiovascular: S1/S2 normal, no murmur, no rub/gallop auscultated. RRR. No lower extremity edema  Gastrointestinal: Nontender, no masses. No hepatomegaly, no splenomegaly. No hernia appreciated. Bowel sounds normal. Rectal exam deferred.   Musculoskeletal: Gait normal. No clubbing/cyanosis  of digits.   Neurological: Normal balance/coordination. No tremor. No cranial nerve deficit on limited exam. Motor and sensation intact and symmetric. Cerebellar reflexes intact.   Skin: warm, dry, intact. No rash/ulcer. No concerning nevi or subq nodules on limited exam.    Psychiatric: Normal judgment/insight. Normal mood and affect.  Oriented x3.      ASSESSMENT/PLAN: The primary encounter diagnosis was Annual physical exam. Diagnoses of Need for influenza vaccination, Essential hypertension, benign, Elevated fasting glucose, and Essential hypertension, benign were also pertinent to this visit.   Orders Placed This Encounter  Procedures  . Flu Vaccine QUAD 6+ mos PF IM (Fluarix Quad PF)  . Hemoglobin A1c  . Lipid panel    Meds ordered this encounter  Medications  . olmesartan (BENICAR) 40 MG tablet    Sig: Take 1 tablet (40 mg total) by mouth daily.    Dispense:  90 tablet    Refill:  3    Patient Instructions  General Preventive Care  Most recent routine screening lipids/other labs: no concerns on labs from June 2019.   We should monitor sugars with A1C test every 6 months or so since this was in the prediabetic range in the past.   Tobacco: don't!   Alcohol: responsible moderation is ok for most adults - if you have concerns about your alcohol intake, please talk to me!   Exercise: to reduce risk of cardiovascular disease and diabetes. Strength training will also prevent osteoporosis.   Mental health: if need for mental health care (medicines, counseling, other), or concerns about moods, please let me know!   Sexual health: if need for STD testing, or if concerns with libido/pain problems, please let me know!  Advanced Directive: Living Will and/or Healthcare Power of Attorney recommended for all adults, regardless of age or health. See attached info.  Vaccines  Flu vaccine: recommended for almost everyone, every fall. Thanks for getting yours today!   Shingles vaccine: Shingrix recommended after age 28.  Pneumonia vaccines: Prevnar and Pneumovax recommended after age 77.  Tetanus booster: Tdap recommended every 10 years. Due 09/2022 Cancer screenings   Colon cancer screening: recommended for everyone at age 43.  Prostate cancer screening: PSA blood test around age 21  Lung cancer  screening: not needed for non-smokers  Infection screenings . HIV, Gonorrhea/Chlamydia: screening as needed.  . Hepatitis C: recommended for anyone born 55-1965 . TB: certain at-risk populations, or depending on work requirements and/or travel history Other . Bone Density Test: recommended for men at age 60, sooner depending on risk factors       Visit summary with medication list and pertinent instructions was printed for patient to review. All questions at time of visit were answered - patient instructed to contact office with any additional concerns or updates. ER/RTC precautions were reviewed with the patient.    Please note: voice recognition software was used to produce this document, and typos may escape review. Please contact Dr. Lyn Hollingshead for any needed clarifications.     Follow-up plan: Return in about 1 year (around 08/16/2019) for ANNUAL PHYSICAL - sooner if needed / based on labs .

## 2018-08-16 LAB — LIPID PANEL
CHOL/HDL RATIO: 3 (calc) (ref <5.0)
Cholesterol: 165 mg/dL (ref <200)
HDL: 55 mg/dL (ref >=40)
LDL CHOLESTEROL (CALC): 95 mg/dL
NON-HDL CHOLESTEROL (CALC): 110 mg/dL (ref <130)
Triglycerides: 64 mg/dL (ref <150)

## 2018-08-16 LAB — HEMOGLOBIN A1C
EAG (MMOL/L): 6.3 (calc)
Hgb A1c MFr Bld: 5.6 % of total Hgb (ref <5.7)
Mean Plasma Glucose: 114 (calc)

## 2018-09-11 ENCOUNTER — Encounter: Payer: Self-pay | Admitting: Osteopathic Medicine

## 2019-07-29 ENCOUNTER — Telehealth: Payer: Self-pay | Admitting: Osteopathic Medicine

## 2019-07-29 DIAGNOSIS — I10 Essential (primary) hypertension: Secondary | ICD-10-CM

## 2019-07-29 MED ORDER — OLMESARTAN MEDOXOMIL 40 MG PO TABS: 40.0000 mg | ORAL_TABLET | Freq: Every day | ORAL | 0 refills | Status: DC

## 2019-07-29 NOTE — Telephone Encounter (Signed)
Patient is calling in wanting a refill on his, olmesartan (BENICAR) 40 MG tablet [[id number]]. Sent to the same pharmacy as before. No further questions at this time and he has made an appointment for a physical in march.

## 2019-07-29 NOTE — Telephone Encounter (Signed)
Enough sent to last until next appt

## 2019-08-20 ENCOUNTER — Encounter: Payer: Self-pay | Admitting: Osteopathic Medicine

## 2019-08-20 ENCOUNTER — Ambulatory Visit (INDEPENDENT_AMBULATORY_CARE_PROVIDER_SITE_OTHER): Payer: BC Managed Care – PPO | Admitting: Osteopathic Medicine

## 2019-08-20 VITALS — BP 129/83 | HR 69 | Temp 97.6°F | Ht 72.0 in | Wt 194.1 lb

## 2019-08-20 DIAGNOSIS — Z0000 Encounter for general adult medical examination without abnormal findings: Secondary | ICD-10-CM | POA: Diagnosis not present

## 2019-08-20 DIAGNOSIS — R7301 Impaired fasting glucose: Secondary | ICD-10-CM | POA: Diagnosis not present

## 2019-08-20 DIAGNOSIS — Z302 Encounter for sterilization: Secondary | ICD-10-CM | POA: Diagnosis not present

## 2019-08-20 DIAGNOSIS — I10 Essential (primary) hypertension: Secondary | ICD-10-CM | POA: Diagnosis not present

## 2019-08-20 NOTE — Patient Instructions (Signed)
General Preventive Care  Most recent routine screening lipids/other labs: ordered today   Tobacco: don't!   Alcohol: responsible moderation is ok for most adults - if you have concerns about your alcohol intake, please talk to me!   Exercise: to reduce risk of cardiovascular disease and diabetes. Strength training will also prevent osteoporosis.   Mental health: if need for mental health care (medicines, counseling, other), or concerns about moods, please let me know!   Sexual health: if need for STD testing, or if concerns with libido/pain problems, please let me know!  Advanced Directive: Living Will and/or Healthcare Power of Attorney recommended for all adults, regardless of age or health. See attached info.  Vaccines  Flu vaccine: recommended for almost everyone, every year preferably in the fall!   Shingles vaccine: Shingrix recommended after age 64.  Pneumonia vaccines: Prevnar and Pneumovax recommended after age 48.  Tetanus booster: Tdap recommended every 10 years. Due 09/2022   COVID: recommended as soon as you're eligible! Please follow [email or web address] for updates on eligibility and vaccination appointment. As of now, we do not anticipate our clinic having vaccines anytime soon.  Cancer screenings   Colon cancer screening: recommended for everyone at age 67.  Prostate cancer screening: PSA blood test around age 9  Lung cancer screening: not needed for non-smokers  Infection screenings  HIV, Gonorrhea/Chlamydia: screening as needed.   Hepatitis C: recommended for anyone born 23-1965  TB: certain at-risk populations, or depending on work requirements and/or travel history Other  Bone Density Test: recommended for men at age 98, sooner depending on risk factors

## 2019-08-20 NOTE — Progress Notes (Signed)
HPI: Ryan Hubbard is a 34 y.o. male who  has a past medical history of Essential hypertension, benign (07/08/2013), Ganglion cyst (05/28/2013), Right supraspinatus tendinitis (05/31/2016), and Thumb pain, left (12/27/2016).  he presents to [facility] today, 08/20/19,  for chief complaint of: Annual physical     Patient here for annual physical / wellness exam.  See preventive care reviewed as below.   Additional concerns today include:  None   Past medical, surgical, social and family history reviewed:  Patient Active Problem List   Diagnosis Date Noted  . Elevated fasting glucose 04/18/2017  . Seasonal allergies 11/20/2013  . Essential hypertension, benign 07/08/2013    No past surgical history on file.  Social History   Tobacco Use  . Smoking status: Never Smoker  . Smokeless tobacco: Never Used  Substance Use Topics  . Alcohol use: Yes    Alcohol/week: 2.0 standard drinks    Types: 2 Shots of liquor per week    Family History  Problem Relation Age of Onset  . Cancer Mother   . Diabetes Mother      Current medication list and allergy/intolerance information reviewed:    Current Outpatient Medications  Medication Sig Dispense Refill  . olmesartan (BENICAR) 40 MG tablet Take 1 tablet (40 mg total) by mouth daily. 90 tablet 0   No current facility-administered medications for this visit.    Allergies  Allergen Reactions  . Atrovent [Ipratropium]     Throat pain      Review of Systems:  Constitutional:  No  fever, no chills, No recent illness, No unintentional weight changes. No significant fatigue.   HEENT: No  headache, no vision change, no hearing change, No sore throat, No  sinus pressure  Cardiac: No  chest pain, No  pressure, No palpitations, No  Orthopnea  Respiratory:  No  shortness of breath. No  Cough  Gastrointestinal: No  abdominal pain, No  nausea, No  vomiting,  No  blood in stool, No  diarrhea,  +occasional constipation   Musculoskeletal: No new myalgia/arthralgia  Skin: No  Rash, No other wounds/concerning lesions  Genitourinary: No  incontinence, No  abnormal genital bleeding, No abnormal genital discharge  Hem/Onc: No  easy bruising/bleeding  Endocrine: No cold intolerance,  No heat intolerance  Neurologic: No  weakness, No  dizziness  Psychiatric: No  concerns with depression, No  concerns with anxiety, +occasional sleep problems, No mood problems  Exam:  BP 129/83 (BP Location: Left Arm, Patient Position: Sitting, Cuff Size: Normal)   Pulse 69   Temp 97.6 F (36.4 C) (Oral)   Ht 6' (1.829 m)   Wt 194 lb 1.9 oz (88.1 kg)   BMI 26.33 kg/m   Constitutional: VS see above. General Appearance: alert, well-developed, well-nourished, NAD  Eyes: Normal lids and conjunctive, non-icteric sclera  Ears, Nose, Mouth, Throat: MMM, Normal external inspection ears/nares/mouth/lips/gums. TM normal bilaterally. Pharynx/tonsils no erythema, no exudate. Nasal mucosa normal.   Neck: No masses, trachea midline. No thyroid enlargement. No tenderness/mass appreciated. No lymphadenopathy  Respiratory: Normal respiratory effort. no wheeze, no rhonchi, no rales  Cardiovascular: S1/S2 normal, no murmur, no rub/gallop auscultated. RRR. No lower extremity edema  Gastrointestinal: Nontender, no masses. No hepatomegaly, no splenomegaly. No hernia appreciated. Bowel sounds normal. Rectal exam deferred.   Musculoskeletal: Gait normal. No clubbing/cyanosis of digits.   Neurological: Normal balance/coordination. No tremor. No cranial nerve deficit on limited exam. Motor and sensation intact and symmetric. Cerebellar reflexes intact.  Skin: warm, dry, intact. No rash/ulcer. No concerning nevi or subq nodules on limited exam.    Psychiatric: Normal judgment/insight. Normal mood and affect. Oriented x3.      ASSESSMENT/PLAN: The primary encounter diagnosis was Annual physical exam.  Diagnoses of Elevated fasting glucose, Essential hypertension, benign, and Encounter for sterilization were also pertinent to this visit.   Orders Placed This Encounter  Procedures  . CBC  . COMPLETE METABOLIC PANEL WITH GFR  . Lipid panel  . Hemoglobin A1c  . Ambulatory referral to Urology    No orders of the defined types were placed in this encounter.   Patient Instructions  General Preventive Care  Most recent routine screening lipids/other labs: ordered today   Tobacco: don't!   Alcohol: responsible moderation is ok for most adults - if you have concerns about your alcohol intake, please talk to me!   Exercise: to reduce risk of cardiovascular disease and diabetes. Strength training will also prevent osteoporosis.   Mental health: if need for mental health care (medicines, counseling, other), or concerns about moods, please let me know!   Sexual health: if need for STD testing, or if concerns with libido/pain problems, please let me know!  Advanced Directive: Living Will and/or Healthcare Power of Attorney recommended for all adults, regardless of age or health. See attached info.  Vaccines  Flu vaccine: recommended for almost everyone, every year preferably in the fall!   Shingles vaccine: Shingrix recommended after age 56.  Pneumonia vaccines: Prevnar and Pneumovax recommended after age 47.  Tetanus booster: Tdap recommended every 10 years. Due 09/2022   COVID: recommended as soon as you're eligible! Please follow [email or web address] for updates on eligibility and vaccination appointment. As of now, we do not anticipate our clinic having vaccines anytime soon.  Cancer screenings   Colon cancer screening: recommended for everyone at age 31.  Prostate cancer screening: PSA blood test around age 58  Lung cancer screening: not needed for non-smokers  Infection screenings  HIV, Gonorrhea/Chlamydia: screening as needed.   Hepatitis C: recommended for anyone born  [id number]  TB: certain at-risk populations, or depending on work requirements and/or travel history Other  Bone Density Test: recommended for men at age 1, sooner depending on risk factors       Visit summary with medication list and pertinent instructions was printed for patient to review. All questions at time of visit were answered - patient instructed to contact office with any additional concerns or updates. ER/RTC precautions were reviewed with the patient.    Please note: voice recognition software was used to produce this document, and typos may escape review. Please contact Dr. Sheppard Coil for any needed clarifications.     Follow-up plan: Return in about 1 year (around 08/19/2020) for [place] (call week prior to visit for lab orders).

## 2019-08-21 LAB — HEMOGLOBIN A1C
Hgb A1c MFr Bld: 5.6 % of total Hgb (ref <5.7)
Mean Plasma Glucose: 114 (calc)
eAG (mmol/L): 6.3 (calc)

## 2019-08-21 LAB — CBC
HCT: 43.4 % (ref 38.5–50.0)
Hemoglobin: 14.6 g/dL (ref 13.2–17.1)
MCH: 32 pg (ref 27.0–33.0)
MCHC: 33.6 g/dL (ref 32.0–36.0)
MCV: 95.2 fL (ref 80.0–100.0)
MPV: 11.6 fL (ref 7.5–12.5)
Platelets: 269 10*3/uL (ref 140–400)
RBC: 4.56 10*6/uL (ref 4.20–5.80)
RDW: 12.5 % (ref 11.0–15.0)
WBC: 8.1 10*3/uL (ref 3.8–10.8)

## 2019-08-21 LAB — LIPID PANEL
Cholesterol: 166 mg/dL (ref <200)
HDL: 61 mg/dL (ref >=40)
LDL Cholesterol (Calc): 90 mg/dL (calc)
Non-HDL Cholesterol (Calc): 105 mg/dL (calc) (ref <130)
Total CHOL/HDL Ratio: 2.7 (calc) (ref <5.0)
Triglycerides: 67 mg/dL (ref <150)

## 2019-08-21 LAB — COMPLETE METABOLIC PANEL WITH GFR
AG Ratio: 2.1 (calc) (ref 1.0–2.5)
ALT: 19 U/L (ref 9–46)
AST: 16 U/L (ref 10–40)
Albumin: 4.9 g/dL (ref 3.6–5.1)
Alkaline phosphatase (APISO): 60 U/L (ref 36–130)
BUN: 12 mg/dL (ref 7–25)
CO2: 27 mmol/L (ref 20–32)
Calcium: 9.9 mg/dL (ref 8.6–10.3)
Chloride: 103 mmol/L (ref 98–110)
Creat: 1.13 mg/dL (ref 0.60–1.35)
GFR, Est African American: 98 mL/min/{1.73_m2} (ref >=60)
GFR, Est Non African American: 85 mL/min/{1.73_m2} (ref >=60)
Globulin: 2.3 g/dL (calc) (ref 1.9–3.7)
Glucose, Bld: 103 mg/dL — ABNORMAL HIGH (ref 65–99)
Potassium: 4.3 mmol/L (ref 3.5–5.3)
Sodium: 139 mmol/L (ref 135–146)
Total Bilirubin: 0.8 mg/dL (ref 0.2–1.2)
Total Protein: 7.2 g/dL (ref 6.1–8.1)

## 2019-08-23 ENCOUNTER — Ambulatory Visit: Payer: Self-pay | Admitting: Sports Medicine

## 2019-09-10 ENCOUNTER — Encounter: Payer: BLUE CROSS/BLUE SHIELD | Admitting: Osteopathic Medicine

## 2019-11-03 ENCOUNTER — Other Ambulatory Visit: Payer: Self-pay | Admitting: Osteopathic Medicine

## 2019-11-03 DIAGNOSIS — I10 Essential (primary) hypertension: Secondary | ICD-10-CM

## 2019-12-09 ENCOUNTER — Other Ambulatory Visit: Payer: Self-pay

## 2019-12-09 ENCOUNTER — Encounter: Payer: Self-pay | Admitting: Osteopathic Medicine

## 2019-12-09 ENCOUNTER — Ambulatory Visit (INDEPENDENT_AMBULATORY_CARE_PROVIDER_SITE_OTHER): Payer: BC Managed Care – PPO | Admitting: Osteopathic Medicine

## 2019-12-09 VITALS — BP 135/90 | HR 73 | Temp 98.0°F | Wt 197.0 lb

## 2019-12-09 DIAGNOSIS — I8392 Asymptomatic varicose veins of left lower extremity: Secondary | ICD-10-CM | POA: Diagnosis not present

## 2019-12-09 MED ORDER — FLUTICASONE PROPIONATE 50 MCG/ACT NA SUSP
NASAL | 3 refills | Status: DC
Start: 2019-12-09 — End: 2020-10-29

## 2019-12-09 NOTE — Progress Notes (Signed)
Ryan Hubbard is a 34 y.o. male who presents to  [facility]  today, 12/09/19, seeking care for the following: . Area of concern on L calf, been there 6+ mos. Not painful.   . On palpation, nonpulsatile, compressible, feels like venous varicosity. Neg Homans and no LE edema      ASSESSMENT & PLAN with other pertinent history/findings:  The encounter diagnosis was Asymptomatic varicose veins of left lower extremity.  Reassured I think this is benign Offered [place] for completeness, pt hashigh deductible plan and is ok to monitor and proceed w/ w/u prn      Follow-up instructions: Return for recheck as needed .                                         BP 135/90 (BP Location: Right Arm, Patient Position: Sitting)   Pulse 73   Temp 98 F (36.7 C)   Wt 197 lb (89.4 kg)   BMI 26.72 kg/m   Current Meds  Medication Sig  . olmesartan (BENICAR) 40 MG tablet TAKE ONE TABLET BY MOUTH DAILY    No results found for this or any previous visit (from the past 72 hour(s)).  No results found.  Depression screen [facility] 2/9 08/20/2019 08/15/2018 04/18/2017  Decreased Interest 0 0 0  Down, Depressed, Hopeless 0 0 0  PHQ - 2 Score 0 0 0  Altered sleeping 0 - 0  Tired, decreased energy 0 - 0  Change in appetite 0 - 0  Feeling bad or failure about yourself  0 - 0  Trouble concentrating 0 - 0  Moving slowly or fidgety/restless 0 - 0  Suicidal thoughts 0 - 0  PHQ-9 Score 0 - 0    GAD 7 : Generalized Anxiety Score 08/20/2019 08/15/2018  Nervous, Anxious, on Edge 0 0  Control/stop worrying 0 0  Worry too much - different things 0 0  Trouble relaxing 0 0  Restless 0 0  Easily annoyed or irritable 0 0  Afraid - awful might happen 0 0  Total GAD 7 Score 0 0      All questions at time of visit were answered - patient instructed to contact office with any additional concerns or updates.   ER/RTC precautions were reviewed with the patient.  Please note: voice recognition software was used to produce this document, and typos may escape review. Please contact Dr. Lyn Hollingshead for any needed clarifications.   Total encounter time: 10 minutes.

## 2020-05-02 ENCOUNTER — Other Ambulatory Visit: Payer: Self-pay | Admitting: Osteopathic Medicine

## 2020-05-02 DIAGNOSIS — I10 Essential (primary) hypertension: Secondary | ICD-10-CM

## 2020-10-28 ENCOUNTER — Other Ambulatory Visit: Payer: Self-pay | Admitting: Osteopathic Medicine

## 2020-10-28 DIAGNOSIS — I10 Essential (primary) hypertension: Secondary | ICD-10-CM

## 2020-10-29 ENCOUNTER — Ambulatory Visit (INDEPENDENT_AMBULATORY_CARE_PROVIDER_SITE_OTHER): Payer: BC Managed Care – PPO | Admitting: Osteopathic Medicine

## 2020-10-29 ENCOUNTER — Encounter: Payer: Self-pay | Admitting: Osteopathic Medicine

## 2020-10-29 ENCOUNTER — Other Ambulatory Visit: Payer: Self-pay

## 2020-10-29 VITALS — BP 123/62 | HR 72 | Temp 98.2°F | Wt 201.0 lb

## 2020-10-29 DIAGNOSIS — I10 Essential (primary) hypertension: Secondary | ICD-10-CM

## 2020-10-29 DIAGNOSIS — Z0000 Encounter for general adult medical examination without abnormal findings: Secondary | ICD-10-CM

## 2020-10-29 DIAGNOSIS — J302 Other seasonal allergic rhinitis: Secondary | ICD-10-CM

## 2020-10-29 MED ORDER — OLMESARTAN MEDOXOMIL 40 MG PO TABS: 40.0000 mg | ORAL_TABLET | Freq: Every day | ORAL | 3 refills | Status: DC

## 2020-10-29 MED ORDER — FLUTICASONE PROPIONATE 50 MCG/ACT NA SUSP: 1.0000 | Freq: Every day | NASAL | 4 refills | Status: AC

## 2020-10-29 NOTE — Progress Notes (Signed)
Ryan Hubbard is a 35 y.o. male who presents to  [facility] at [facility]  today, 10/29/20, seeking care for the following:  . Annual physical      ASSESSMENT & PLAN with other pertinent findings:  The primary encounter diagnosis was Annual physical exam. Diagnoses of Essential hypertension, benign and Seasonal allergies were also pertinent to this visit.    Patient Instructions  General Preventive Care  Most recent routine screening lipids/other labs: ordered today   Tobacco: don't!   Alcohol: responsible moderation is ok for most adults - if you have concerns about your alcohol intake, please talk to me!   Exercise: to reduce risk of cardiovascular disease and diabetes. Strength training will also prevent osteoporosis.   Mental health: if need for mental health care (medicines, counseling, other), or concerns about moods, please let me know!   Sexual health: if need for STD testing, or if concerns with libido/pain problems, please let me know!  Advanced Directive: Living Will and/or Healthcare Power of Attorney recommended for all adults, regardless of age or health. See attached info.  Vaccines  Flu vaccine: recommended every year preferably in the fall!   Shingles vaccine: Shingrix recommended after age 47.  Pneumonia vaccines: Prevnar and Pneumovax recommended after age 60.  Tetanus booster: Tdap recommended every 10 years. Due 09/2022   COVID vaccine: strongly recommended!  Cancer screenings   Colon cancer screening: recommended for everyone at age 40.  Prostate cancer screening: PSA blood test around age 4  Lung cancer screening: not needed for non-smokers  Infection screenings  HIV, Gonorrhea/Chlamydia: screening as needed.   Hepatitis C: recommended for anyone born 71-1965  TB: certain at-risk populations, or depending on work requirements and/or travel history Other  Bone Density Test:  recommended for men at age 33   Orders Placed This Encounter  Procedures  . COMPLETE METABOLIC PANEL WITH GFR  . CBC  . Lipid panel    Meds ordered this encounter  Medications  . fluticasone (FLONASE) 50 MCG/ACT nasal spray    Sig: Place 1-2 sprays into both nostrils daily.    Dispense:  48 g    Refill:  4  . olmesartan (BENICAR) 40 MG tablet    Sig: Take 1 tablet (40 mg total) by mouth daily.    Dispense:  90 tablet    Refill:  3     See below for relevant physical exam findings  See below for recent lab and imaging results reviewed  Medications, allergies, PMH, PSH, SocH, FamH reviewed below    Follow-up instructions: Return in about 1 year (around 10/29/2021) for [place] (call week prior to visit for lab orders).                                        Exam:  BP 123/62 (BP Location: Left Arm, Patient Position: Sitting, Cuff Size: Normal)   Pulse 72   Temp 98.2 F (36.8 C) (Oral)   Wt 201 lb 0.6 oz (91.2 kg)   BMI 27.27 kg/m   Constitutional: VS see above. General Appearance: alert, well-developed, well-nourished, NAD  Neck: No masses, trachea midline.   Respiratory: Normal respiratory effort. no wheeze, no rhonchi, no rales  Cardiovascular: S1/S2 normal, no murmur, no rub/gallop auscultated. RRR.   Musculoskeletal: Gait normal. Symmetric and independent movement of all extremities  Abdominal: non-tender, non-distended, no appreciable  organomegaly, neg Murphy's, BS [id number]  Neurological: Normal balance/coordination. No tremor.  Skin: warm, dry, intact.   Psychiatric: Normal judgment/insight. Normal mood and affect. Oriented x3.   Current Meds  Medication Sig  . [DISCONTINUED] fluticasone (FLONASE) 50 MCG/ACT nasal spray One spray in each nostril twice a day, use left hand for right nostril, and right hand for left nostril.  . [DISCONTINUED] olmesartan (BENICAR) 40 MG tablet TAKE ONE TABLET BY MOUTH DAILY    Allergies   Allergen Reactions  . Atrovent [Ipratropium]     Throat pain    Patient Active Problem List   Diagnosis Date Noted  . Elevated fasting glucose 04/18/2017  . Seasonal allergies 11/20/2013  . Essential hypertension, benign 07/08/2013    Family History  Problem Relation Age of Onset  . Cancer Mother   . Diabetes Mother     Social History   Tobacco Use  Smoking Status Never Smoker  Smokeless Tobacco Never Used    No past surgical history on file.  Immunization History  Administered Date(s) Administered  . Hepatitis B 03/19/1997, 05/17/1997, 09/24/1997  . Influenza,inj,Quad PF,6+ Mos 08/15/2018  . Moderna Sars-Covid-2 Vaccination 06/09/2020  . PFIZER(Purple Top)SARS-COV-2 Vaccination 09/27/2019, 10/18/2019  . Tdap 09/10/2012    Recent Results (from the past 2160 hour(s))  CBC     Status: None   Collection Time: 10/29/20  8:44 AM  Result Value Ref Range   WBC 7.9 3.8 - 10.8 Thousand/uL   RBC 4.47 4.20 - 5.80 Million/uL   Hemoglobin 14.7 13.2 - 17.1 g/dL   HCT 65.9 93.5 - 70.1 %   MCV 97.8 80.0 - 100.0 fL   MCH 32.9 27.0 - 33.0 pg   MCHC 33.6 32.0 - 36.0 g/dL   RDW 77.9 39.0 - 30.0 %   Platelets 260 140 - 400 Thousand/uL   MPV 12.0 7.5 - 12.5 fL    No results found.     All questions at time of visit were answered - patient instructed to contact office with any additional concerns or updates. ER/RTC precautions were reviewed with the patient as applicable.   Please note: manual typing as well as voice recognition software may have been used to produce this document - typos may escape review. Please contact Dr. Lyn Hollingshead for any needed clarifications.

## 2020-10-29 NOTE — Patient Instructions (Signed)
General Preventive Care  Most recent routine screening lipids/other labs: ordered today   Tobacco: don't!   Alcohol: responsible moderation is ok for most adults - if you have concerns about your alcohol intake, please talk to me!   Exercise: to reduce risk of cardiovascular disease and diabetes. Strength training will also prevent osteoporosis.   Mental health: if need for mental health care (medicines, counseling, other), or concerns about moods, please let me know!   Sexual health: if need for STD testing, or if concerns with libido/pain problems, please let me know!  Advanced Directive: Living Will and/or Healthcare Power of Attorney recommended for all adults, regardless of age or health. See attached info.  Vaccines  Flu vaccine: recommended every year preferably in the fall!   Shingles vaccine: Shingrix recommended after age 71.  Pneumonia vaccines: Prevnar and Pneumovax recommended after age 81.  Tetanus booster: Tdap recommended every 10 years. Due 09/2022   COVID vaccine: strongly recommended!  Cancer screenings   Colon cancer screening: recommended for everyone at age 67.  Prostate cancer screening: PSA blood test around age 92  Lung cancer screening: not needed for non-smokers  Infection screenings  HIV, Gonorrhea/Chlamydia: screening as needed.   Hepatitis C: recommended for anyone born 35-1965  TB: certain at-risk populations, or depending on work requirements and/or travel history Other  Bone Density Test: recommended for men at age 22

## 2020-10-30 LAB — COMPLETE METABOLIC PANEL WITH GFR
AG Ratio: 2.3 (calc) (ref 1.0–2.5)
ALT: 26 U/L (ref 9–46)
AST: 21 U/L (ref 10–40)
Albumin: 5 g/dL (ref 3.6–5.1)
Alkaline phosphatase (APISO): 62 U/L (ref 36–130)
BUN: 12 mg/dL (ref 7–25)
CO2: 28 mmol/L (ref 20–32)
Calcium: 9.9 mg/dL (ref 8.6–10.3)
Chloride: 103 mmol/L (ref 98–110)
Creat: 1.13 mg/dL (ref 0.60–1.35)
GFR, Est African American: 98 mL/min/{1.73_m2} (ref >=60)
GFR, Est Non African American: 84 mL/min/{1.73_m2} (ref >=60)
Globulin: 2.2 g/dL (calc) (ref 1.9–3.7)
Glucose, Bld: 94 mg/dL (ref 65–99)
Potassium: 4.4 mmol/L (ref 3.5–5.3)
Sodium: 139 mmol/L (ref 135–146)
Total Bilirubin: 1.1 mg/dL (ref 0.2–1.2)
Total Protein: 7.2 g/dL (ref 6.1–8.1)

## 2020-10-30 LAB — CBC
HCT: 43.7 % (ref 38.5–50.0)
Hemoglobin: 14.7 g/dL (ref 13.2–17.1)
MCH: 32.9 pg (ref 27.0–33.0)
MCHC: 33.6 g/dL (ref 32.0–36.0)
MCV: 97.8 fL (ref 80.0–100.0)
MPV: 12 fL (ref 7.5–12.5)
Platelets: 260 10*3/uL (ref 140–400)
RBC: 4.47 10*6/uL (ref 4.20–5.80)
RDW: 12.2 % (ref 11.0–15.0)
WBC: 7.9 10*3/uL (ref 3.8–10.8)

## 2020-10-30 LAB — LIPID PANEL
Cholesterol: 170 mg/dL (ref <200)
HDL: 62 mg/dL (ref >=40)
LDL Cholesterol (Calc): 91 mg/dL (calc)
Non-HDL Cholesterol (Calc): 108 mg/dL (calc) (ref <130)
Total CHOL/HDL Ratio: 2.7 (calc) (ref <5.0)
Triglycerides: 79 mg/dL (ref <150)

## 2020-11-03 ENCOUNTER — Ambulatory Visit (INDEPENDENT_AMBULATORY_CARE_PROVIDER_SITE_OTHER): Payer: BC Managed Care – PPO

## 2020-11-03 ENCOUNTER — Other Ambulatory Visit: Payer: Self-pay

## 2020-11-03 ENCOUNTER — Ambulatory Visit (INDEPENDENT_AMBULATORY_CARE_PROVIDER_SITE_OTHER): Payer: BC Managed Care – PPO | Admitting: Sports Medicine

## 2020-11-03 DIAGNOSIS — G8929 Other chronic pain: Secondary | ICD-10-CM | POA: Insufficient documentation

## 2020-11-03 DIAGNOSIS — M545 Low back pain, unspecified: Secondary | ICD-10-CM

## 2020-11-03 NOTE — Progress Notes (Signed)
    Procedures performed today:    None.  Independent interpretation of notes and tests performed by another provider:   None.  Brief History, Exam, Impression, and Recommendations:    Chronic low back pain This is a pleasant 35 year old male, he has a 2-year history of low back pain. Axial, worse with sitting, flexion, Valsalva, nothing radicular, no red flag symptoms. We discussed the anatomy, pathophysiology of low back pain, he will do some core exercises, getting x-rays due to duration of symptoms, he will try an anti-inflammatory diet and avoid provocative motions. Return to see me in a video visit in 6 weeks to reevaluate, he does understand the chronic nature of this process. His activities are not limited.    ___________________________________________ [name]. Benjamin Stain, M.D., ABFM., CAQSM. Primary Care and Sports Medicine [facility]  Adjunct Instructor of Family Medicine  [employer]

## 2020-11-03 NOTE — Assessment & Plan Note (Signed)
This is a pleasant 35 year old male, he has a 2-year history of low back pain. Axial, worse with sitting, flexion, Valsalva, nothing radicular, no red flag symptoms. We discussed the anatomy, pathophysiology of low back pain, he will do some core exercises, getting x-rays due to duration of symptoms, he will try an anti-inflammatory diet and avoid provocative motions. Return to see me in a video visit in 6 weeks to reevaluate, he does understand the chronic nature of this process. His activities are not limited.

## 2020-12-15 ENCOUNTER — Telehealth (INDEPENDENT_AMBULATORY_CARE_PROVIDER_SITE_OTHER): Payer: BC Managed Care – PPO | Admitting: Sports Medicine

## 2020-12-15 DIAGNOSIS — G8929 Other chronic pain: Secondary | ICD-10-CM

## 2020-12-15 DIAGNOSIS — M545 Low back pain, unspecified: Secondary | ICD-10-CM | POA: Diagnosis not present

## 2020-12-15 NOTE — Assessment & Plan Note (Signed)
This is a pleasant 35 year old male, I saw him approximately 6 weeks ago for a 2-year history of low back pain, discogenic in nature without red flags, x-rays were unremarkable, we put him on an anti-inflammatory diet and had him do some aggressive core strengthening. He returns today doing a lot better, he will continue this indefinitely and return to see me as needed.

## 2020-12-15 NOTE — Progress Notes (Signed)
   Virtual Visit via WebEx/MyChart   I connected with  Ryan Hubbard  on 12/15/20 via WebEx/MyChart/Doximity Video and verified that I am speaking with the correct person using two identifiers.   I discussed the limitations, risks, security and privacy concerns of performing an evaluation and management service by WebEx/MyChart/Doximity Video, including the higher likelihood of inaccurate diagnosis and treatment, and the availability of in person appointments.  We also discussed the likely need of an additional face to face encounter for complete and high quality delivery of care.  I also discussed with the patient that there may be a patient responsible charge related to this service. The patient expressed understanding and wishes to proceed.  Provider location is in medical facility. Patient location is at their home, different from provider location. People involved in care of the patient during this telehealth encounter were myself, my nurse/medical assistant, and my front office/scheduling team member.  Review of Systems: No fevers, chills, night sweats, weight loss, chest pain, or shortness of breath.   Objective Findings:    General: Speaking full sentences, no audible heavy breathing.  Sounds alert and appropriately interactive.  Appears well.  Face symmetric.  Extraocular movements intact.  Pupils equal and round.  No nasal flaring or accessory muscle use visualized.  Independent interpretation of tests performed by another provider:   None.  Brief History, Exam, Impression, and Recommendations:    Chronic low back pain This is a pleasant 35 year old male, I saw him approximately 6 weeks ago for a 2-year history of low back pain, discogenic in nature without red flags, x-rays were unremarkable, we put him on an anti-inflammatory diet and had him do some aggressive core strengthening. He returns today doing a lot better, he will continue this indefinitely and return to see  me as needed.   I discussed the above assessment and treatment plan with the patient. The patient was provided an opportunity to ask questions and all were answered. The patient agreed with the plan and demonstrated an understanding of the instructions.   The patient was advised to call back or seek an in-person evaluation if the symptoms worsen or if the condition fails to improve as anticipated.   I provided 15 minutes of face to face and non-face-to-face time during this encounter date, time was needed to gather information, review chart, records, communicate/coordinate with staff remotely, as well as complete documentation.   ___________________________________________ Ryan Hubbard. Benjamin Stain, M.D., ABFM., CAQSM. Primary Care and Sports Medicine [facility]  Adjunct Instructor of Family Medicine  [employer]

## 2021-06-14 ENCOUNTER — Other Ambulatory Visit: Payer: Self-pay

## 2021-06-14 ENCOUNTER — Ambulatory Visit (INDEPENDENT_AMBULATORY_CARE_PROVIDER_SITE_OTHER): Payer: BC Managed Care – PPO | Admitting: Family Medicine

## 2021-06-14 ENCOUNTER — Encounter: Payer: Self-pay | Admitting: Family Medicine

## 2021-06-14 VITALS — BP 134/69 | HR 75 | Temp 97.8°F | Ht 72.0 in | Wt 193.0 lb

## 2021-06-14 DIAGNOSIS — F419 Anxiety disorder, unspecified: Secondary | ICD-10-CM | POA: Diagnosis not present

## 2021-06-14 DIAGNOSIS — F32A Depression, unspecified: Secondary | ICD-10-CM | POA: Diagnosis not present

## 2021-06-14 MED ORDER — HYDROXYZINE HCL 50 MG PO TABS: 50.0000 mg | ORAL_TABLET | Freq: Three times a day (TID) | ORAL | 3 refills | Status: DC | PRN

## 2021-06-14 MED ORDER — SERTRALINE HCL 25 MG PO TABS: 25.0000 mg | ORAL_TABLET | Freq: Every day | ORAL | 2 refills | Status: DC

## 2021-06-14 NOTE — Patient Instructions (Signed)
Starting Zoloft (sertraline) 25 mg daily and hydroxyzine 50 mg three times daily as needed (start with bedtime use as this will likely make you sleepy).  Continue with the counseling program you signed up for. If you decide you want [place] to make you a referral to somewhere local, please let us know.   Taking the medicine as directed and not missing any doses is one of the best things you can do to treat your anxiety/depression.  Here are some things to keep in mind: Side effects (stomach upset, some increased anxiety) may happen before you notice a benefit.  These side effects typically go away over time. Changes to your dose of medicine or a change in medication all together is sometimes necessary Many people will notice an improvement within two weeks but the full effect of the medication can take up to 4-6 weeks Stopping the medication when you start feeling better often results in a return of symptoms. Most people need to be on medication at least 6-12 months If you start having thoughts of hurting yourself or others after starting this medicine, please call me immediately.

## 2021-06-14 NOTE — Progress Notes (Signed)
Acute Office Visit  Subjective:    Patient ID: Ryan Hubbard, male    DOB: [birth year], 35 y.o.   MRN: [id number]  Chief Complaint  Patient presents with   Anxiety     Patient is in today for anxiety.   Patient reports that he has had a few stressful months and he feels his anxiety worsening. States that he has typically been the guy who has it all together and doesn't dwell on things, but the past few months have been hard. He reports that initially the anxiety wasn't too bad and he was able to cope with exercise and family time, however there was a significant peak this past month and he is extremely anxious now. States he cannot think of a specific trigger other than possible having an employee leave, causing him to pick up extra work and have to hire someone else. He reports that all of his anxiety is work related. States that he has a really good life otherwise. 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 He reports that all of his peers and leadership tell him he is doing great, but he feels like he is constantly overanalyzing himself and worried about making mistakes or getting fired. He states they have been pretty busy lately at work - he works as a Engineer, materials for a bank.  Reports that he feels like his mind is always racing, occasionally anxiety will cause his heart to race as well. States for the past 2 weeks he has averaged probably 3 hours of sleep per night. Reports that he will fall asleep at a usual time and then wake up a few hours later with his mind racing. He will go down the hall to watch TV to try to distract himself from his own thoughts.   His wife is a wonderful support for him and will often come sit with him and try to help him calm down. He reports that all of his coworkers and leadership are also very supportive. He recently signed up for [facility] app for  virtual counseling - his first appointment is later this week. He reports that both of his parents struggled with drug addictions, so he is very hesitant to become dependent on anything. Reports that he will drink alcohol socially, but has not done any drugs.   Patient did have tearful moments during the visit. He denies any suicidal/homicidal ideation.     Past Medical History:  Diagnosis Date   Essential hypertension, benign 07/08/2013   Ganglion cyst 05/28/2013   Right supraspinatus tendinitis 05/31/2016   Thumb pain, left 12/27/2016    No past surgical history on file.  Family History  Problem Relation Age of Onset   Cancer Mother    Diabetes Mother     Social History   Socioeconomic History   Marital status: Married    Spouse name: Not on file   Number of children: 0   Years of education: 16   Highest education level: Not on file  Occupational History   Occupation: credit anaylst    Comment: [employer]  Tobacco Use   Smoking status: Never   Smokeless tobacco: Never  Substance and Sexual Activity   Alcohol use: Yes    Alcohol/week: 2.0 standard drinks    Types: 2 Shots of liquor per week   Drug use: No   Sexual activity: Yes  Other Topics Concern   Not on file  Social History Narrative   Not on file   Social Determinants of Health   Financial Resource Strain: Not on file  Food Insecurity: Not on file  Transportation Needs: Not on file  Physical Activity: Not on file  Stress: Not on file  Social Connections: Not on file  Intimate Partner Violence: Not on file    Outpatient Medications Prior to Visit  Medication Sig Dispense Refill   fluticasone (FLONASE) 50 MCG/ACT nasal spray Place 1-2 sprays into both nostrils daily. 48 g 4   olmesartan (BENICAR) 40 MG tablet Take 1 tablet (40 mg total) by mouth daily. 90 tablet 3   Omega-3 Fatty Acids (FISH OIL) 1000 MG CPDR Take 2 capsules by mouth daily.     No facility-administered medications prior to visit.     Allergies  Allergen Reactions   Atrovent [Ipratropium]     Throat pain    Review of Systems All review of systems negative except what is listed in the HPI     Objective:    Physical Exam Vitals reviewed.  Constitutional:      Appearance: Normal appearance. He is normal weight.  HENT:     Head: Normocephalic.  Cardiovascular:     Rate and Rhythm: Normal rate and regular rhythm.     Heart sounds: Normal heart sounds.  Pulmonary:     Effort: Pulmonary effort is normal.     Breath sounds: Normal breath sounds.  Skin:    General: Skin is warm and dry.  Neurological:     General: No focal deficit present.     Mental Status: He is alert and oriented to person, place, and time. Mental status is at baseline.  Psychiatric:        Mood and Affect: Mood normal.        Behavior: Behavior normal.        Thought Content: Thought content normal.        Judgment: Judgment normal.    BP 134/69 (BP Location: Left Arm, Patient Position: Sitting, Cuff Size: Normal)   Pulse 75   Temp 97.8 F (36.6 C)   Ht 6' (1.829 m)   Wt 193 lb (87.5 kg)   SpO2 99%   BMI 26.18 kg/m  Wt Readings from Last 3 Encounters:  06/14/21 193 lb (87.5 kg)  10/29/20 201 lb 0.6 oz (91.2 kg)  12/09/19 197 lb (89.4 kg)    Health Maintenance Due  Topic Date Due   HIV Screening  Never done   Hepatitis C Screening  Never done    There are no preventive care reminders to display for this patient.   Lab Results  Component Value Date   TSH 0.80 07/19/2017   Lab Results  Component Value Date   WBC 7.9 10/29/2020   HGB 14.7 10/29/2020   HCT 43.7 10/29/2020   MCV 97.8 10/29/2020   PLT 260 10/29/2020   Lab Results  Component Value Date   NA 139 10/29/2020   K 4.4 10/29/2020   CO2 28 10/29/2020   GLUCOSE 94 10/29/2020   BUN 12 10/29/2020   CREATININE 1.13 10/29/2020   BILITOT 1.1 10/29/2020   ALKPHOS 68 08/15/2014   AST 21 10/29/2020   ALT 26 10/29/2020   PROT 7.2 10/29/2020   ALBUMIN  4.9 12/15/2016   CALCIUM 9.9 10/29/2020   Lab Results  Component Value Date   CHOL 170 10/29/2020   Lab Results  Component Value Date  HDL 62 10/29/2020   Lab Results  Component Value Date   LDLCALC 91 10/29/2020   Lab Results  Component Value Date   TRIG 79 10/29/2020   Lab Results  Component Value Date   CHOLHDL 2.7 10/29/2020   Lab Results  Component Value Date   HGBA1C 5.6 08/20/2019       Assessment & Plan:   1. Anxiety and depression Starting Zoloft (sertraline) 25 mg daily and hydroxyzine 50 mg three times daily as needed (start with bedtime use as this will likely make you sleepy).  Continue with the counseling program you signed up for. If you decide you want [place] to make you a referral to somewhere local, please let us know.   Taking the medicine as directed and not missing any doses is one of the best things you can do to treat your anxiety/depression.  Here are some things to keep in mind: Side effects (stomach upset, some increased anxiety) may happen before you notice a benefit.  These side effects typically go away over time. Changes to your dose of medicine or a change in medication all together is sometimes necessary Many people will notice an improvement within two weeks but the full effect of the medication can take up to 4-6 weeks Stopping the medication when you start feeling better often results in a return of symptoms. Most people need to be on medication at least 6-12 months If you start having thoughts of hurting yourself or others after starting this medicine, please call me immediately.     - sertraline (ZOLOFT) 25 MG tablet; Take 1 tablet (25 mg total) by mouth daily.  Dispense: 30 tablet; Refill: 2 - hydrOXYzine (ATARAX) 50 MG tablet; Take 1 tablet (50 mg total) by mouth 3 (three) times daily as needed for anxiety.  Dispense: 30 tablet; Refill: 3    Follow-up in 4-6 weeks or sooner if needed.   Lollie Marrow Reola Calkins, DNP, FNP-C

## 2021-06-23 ENCOUNTER — Telehealth: Payer: Self-pay

## 2021-06-23 NOTE — Telephone Encounter (Signed)
Patient left a vm msg requesting if provider can suggest a more effective rx than hydroxyzine. Per patient still having trouble sleeping. It is affecting his quality of life and daily activities. Hydroxyzine rx is not helping with anxiety or insomnia. Pls advise, thanks.

## 2021-06-23 NOTE — Telephone Encounter (Signed)
Contacted patient regarding his request. Per patient, he is taking the Zoloft rx daily. He did increase the hydroxyzine rx to 2 tabs at bedtime. The problem he is having is that he can't stay asleep for long period of times (at most he gets about 3 hours). Patient has requested to have a visit to discuss alternative medications. Scheduled as a virtual appointment on 06/25/21.

## 2021-06-25 ENCOUNTER — Telehealth (INDEPENDENT_AMBULATORY_CARE_PROVIDER_SITE_OTHER): Payer: BC Managed Care – PPO | Admitting: Family Medicine

## 2021-06-25 ENCOUNTER — Encounter: Payer: Self-pay | Admitting: Family Medicine

## 2021-06-25 DIAGNOSIS — F419 Anxiety disorder, unspecified: Secondary | ICD-10-CM

## 2021-06-25 DIAGNOSIS — G4709 Other insomnia: Secondary | ICD-10-CM | POA: Diagnosis not present

## 2021-06-25 DIAGNOSIS — F32A Depression, unspecified: Secondary | ICD-10-CM | POA: Diagnosis not present

## 2021-06-25 MED ORDER — AMITRIPTYLINE HCL 25 MG PO TABS: 25.0000 mg | ORAL_TABLET | Freq: Every day | ORAL | 0 refills | Status: DC

## 2021-06-25 NOTE — Progress Notes (Signed)
Virtual Video Visit via MyChart Note  I connected with  Ryan Hubbard on 06/25/21 at  9:30 AM EST by the video enabled telemedicine application for MyChart, and verified that I am speaking with the correct person using two identifiers.   I introduced myself as a Publishing rights manager with the practice. We discussed the limitations of evaluation and management by telemedicine and the availability of in person appointments. The patient expressed understanding and agreed to proceed.  Participating parties in this visit include: The patient and the nurse practitioner listed.  The patient is: At home I am: In the office - Primary Care [facility]  Subjective:    CC:  Chief Complaint  Patient presents with   Medication Problem    Alternative rx for insomnia    HPI: Ryan Hubbard is a 35 y.o. year old male presenting today via MyChart today for insomnia.  Patient states that the zoloft does seem to be having some positive effects so far. The hydroxyzine has been helpful in the afternoons when anxiety starts climbing, but it is not seeming to help him sleep at night. He is still only getting 3-4 consecutive hours of sleep per night usually. He would like to discuss alternative options for insomnia. He has been doing counseling/therapy and that seems to be helping with mood as well, but he agrees that getting adequate sleep will be extremely beneficial.     Past medical history, Surgical history, Family history not pertinant except as noted below, Social history, Allergies, and medications have been entered into the medical record, reviewed, and corrections made.   Review of Systems:  All review of systems negative except what is listed in the HPI   Objective:    General:  Speaking clearly in complete sentences. Absent shortness of breath noted.   Alert and oriented x3.   Normal judgment.  Absent acute distress.   Impression and Recommendations:    1. Anxiety and  depression - amitriptyline (ELAVIL) 25 MG tablet; Take 1 tablet (25 mg total) by mouth at bedtime.  Dispense: 30 tablet; Refill: 0  2. Other insomnia - amitriptyline (ELAVIL) 25 MG tablet; Take 1 tablet (25 mg total) by mouth at bedtime.  Dispense: 30 tablet; Refill: 0  Discussed case with Christen Butter, NP. Will start on low dose amitriptyline at bedtime to help with insomnia and mood. Patient was educated on (unlikely but potential) risk of serotonin syndrome and symptoms to monitor for. He is on lowest dose Zoloft as well. He is aware that he should avoid alcohol. He also requests some info on trazodone in case he does not like the amitriptyline. Patient aware of signs/symptoms requiring further/urgent evaluation. He has an appointment next week with PCP to follow-up on mood and insomnia.    Follow-up if symptoms worsen or fail to improve.    I discussed the assessment and treatment plan with the patient. The patient was provided an opportunity to ask questions and all were answered. The patient agreed with the plan and demonstrated an understanding of the instructions.   The patient was advised to call back or seek an in-person evaluation if the symptoms worsen or if the condition fails to improve as anticipated.  I spent 20 minutes dedicated to the care of this patient on the date of this encounter to include pre-visit chart review of prior notes and results, face-to-face time with the patient, and post-visit ordering of testing as indicated.   Clayborne Dana, NP

## 2021-07-22 ENCOUNTER — Other Ambulatory Visit: Payer: Self-pay

## 2021-07-22 DIAGNOSIS — F32A Depression, unspecified: Secondary | ICD-10-CM

## 2021-07-22 DIAGNOSIS — F419 Anxiety disorder, unspecified: Secondary | ICD-10-CM

## 2021-07-22 DIAGNOSIS — G4709 Other insomnia: Secondary | ICD-10-CM

## 2021-07-22 MED ORDER — AMITRIPTYLINE HCL 25 MG PO TABS: 25.0000 mg | ORAL_TABLET | Freq: Every day | ORAL | 0 refills | Status: DC

## 2021-07-23 ENCOUNTER — Other Ambulatory Visit: Payer: Self-pay | Admitting: Family Medicine

## 2021-07-23 DIAGNOSIS — F419 Anxiety disorder, unspecified: Secondary | ICD-10-CM

## 2021-07-23 DIAGNOSIS — F32A Depression, unspecified: Secondary | ICD-10-CM

## 2021-07-23 NOTE — Telephone Encounter (Signed)
Rx sent to pt current PCP.

## 2021-07-26 NOTE — Telephone Encounter (Signed)
I have called the pharmacy and they stated pt is out of rx since he filled the rx every 10 days since it was for 1 tab tid prn #30. Thus he is out of medication.   Please assist. Does pt need to be seen again or does he need a rx refill for #90 vs #30.

## 2021-08-02 ENCOUNTER — Encounter: Payer: Self-pay | Admitting: Medical-Surgical

## 2021-08-02 ENCOUNTER — Other Ambulatory Visit: Payer: Self-pay

## 2021-08-02 ENCOUNTER — Ambulatory Visit (INDEPENDENT_AMBULATORY_CARE_PROVIDER_SITE_OTHER): Payer: BC Managed Care – PPO | Admitting: Medical-Surgical

## 2021-08-02 VITALS — BP 137/82 | HR 67 | Resp 20 | Ht 72.0 in | Wt 195.0 lb

## 2021-08-02 DIAGNOSIS — I10 Essential (primary) hypertension: Secondary | ICD-10-CM | POA: Diagnosis not present

## 2021-08-02 DIAGNOSIS — F32A Depression, unspecified: Secondary | ICD-10-CM

## 2021-08-02 DIAGNOSIS — Z7689 Persons encountering health services in other specified circumstances: Secondary | ICD-10-CM | POA: Diagnosis not present

## 2021-08-02 DIAGNOSIS — F419 Anxiety disorder, unspecified: Secondary | ICD-10-CM | POA: Diagnosis not present

## 2021-08-02 MED ORDER — SERTRALINE HCL 25 MG PO TABS: 25.0000 mg | ORAL_TABLET | Freq: Every day | ORAL | 1 refills | Status: DC

## 2021-08-02 NOTE — Progress Notes (Signed)
°  HPI with pertinent ROS:   CC: Transfer of care, sleep  HPI: Pleasant 36 year old male presenting today to transfer care to a new PCP.  Recently seen for anxiety depression and started on sertraline 25 mg daily with amitriptyline 25 mg nightly.  Has been taking both medications, tolerating well without side effects.  Notes that Zoloft has helped with his overall mood and the amitriptyline is helping him sleep at night.  Feels that his burst of anxiety has resolved although he is not ready to stop the medications yet.  Notes that he has had difficulty sleeping most of his life.  His mind starts racing and he is unable to calm his thoughts enough to go to sleep.  Most nights he does go to sleep initially but when he wakes in the early morning hours between 2 and 4 AM, he is unable to get back to sleep.  He wants to avoid medications that are habit-forming/addictive.  Initially started out with hydroxyzine which helped for a short period but this is no longer helpful.  I reviewed the past medical history, family history, social history, surgical history, and allergies today and no changes were needed.  Please see the problem list section below in epic for further details.   Physical exam:   General: Well Developed, well nourished, and in no acute distress.  Neuro: Alert and oriented x3.  HEENT: Normocephalic, atraumatic.  Skin: Warm and dry. Cardiac: Regular rate and rhythm.  Respiratory: Not using accessory muscles, speaking in full sentences.  Impression and Recommendations:    1. Encounter to establish care Reviewed available information and discussed care concerns with patient.   2. Anxiety and depression Continue sertraline 25 mg daily.  Increasing amitriptyline to 50 mg nightly to help with mood and sleep.  Okay to use hydroxyzine if desired.  I will send him a MyChart message in the next couple of weeks to see if this is a good regimen and he is sleeping better. - sertraline (ZOLOFT)  25 MG tablet; Take 1 tablet (25 mg total) by mouth daily.  Dispense: 90 tablet; Refill: 1  3. Essential hypertension, benign Recheck of blood pressure at goal.  Continue olmesartan 40 mg daily.  Return in about 6 months (around 01/30/2022) for mood follow up. ___________________________________________ [name], DNP, APRN, FNP-BC Primary Care and Sports Medicine [facility]

## 2021-08-11 ENCOUNTER — Other Ambulatory Visit: Payer: Self-pay

## 2021-08-11 DIAGNOSIS — G4709 Other insomnia: Secondary | ICD-10-CM

## 2021-08-11 DIAGNOSIS — F419 Anxiety disorder, unspecified: Secondary | ICD-10-CM

## 2021-08-11 MED ORDER — AMITRIPTYLINE HCL 50 MG PO TABS: ORAL_TABLET | ORAL | 1 refills | Status: DC

## 2021-08-16 ENCOUNTER — Encounter: Payer: Self-pay | Admitting: Medical-Surgical

## 2021-08-18 ENCOUNTER — Other Ambulatory Visit: Payer: Self-pay | Admitting: Medical-Surgical

## 2021-08-18 DIAGNOSIS — G4709 Other insomnia: Secondary | ICD-10-CM

## 2021-08-18 DIAGNOSIS — F32A Depression, unspecified: Secondary | ICD-10-CM

## 2021-09-29 ENCOUNTER — Other Ambulatory Visit: Payer: Self-pay

## 2021-09-29 ENCOUNTER — Encounter: Payer: Self-pay | Admitting: Medical-Surgical

## 2021-09-29 ENCOUNTER — Ambulatory Visit (INDEPENDENT_AMBULATORY_CARE_PROVIDER_SITE_OTHER): Payer: BC Managed Care – PPO | Admitting: Medical-Surgical

## 2021-09-29 VITALS — BP 129/87 | HR 74 | Ht 72.0 in | Wt 205.0 lb

## 2021-09-29 DIAGNOSIS — R7301 Impaired fasting glucose: Secondary | ICD-10-CM

## 2021-09-29 DIAGNOSIS — F32A Depression, unspecified: Secondary | ICD-10-CM

## 2021-09-29 DIAGNOSIS — Z0000 Encounter for general adult medical examination without abnormal findings: Secondary | ICD-10-CM | POA: Diagnosis not present

## 2021-09-29 DIAGNOSIS — Z13220 Encounter for screening for lipoid disorders: Secondary | ICD-10-CM

## 2021-09-29 DIAGNOSIS — F419 Anxiety disorder, unspecified: Secondary | ICD-10-CM

## 2021-09-29 DIAGNOSIS — G4709 Other insomnia: Secondary | ICD-10-CM

## 2021-09-29 DIAGNOSIS — I10 Essential (primary) hypertension: Secondary | ICD-10-CM

## 2021-09-29 DIAGNOSIS — Z1329 Encounter for screening for other suspected endocrine disorder: Secondary | ICD-10-CM | POA: Diagnosis not present

## 2021-09-29 NOTE — Patient Instructions (Signed)
Preventive Care 21-36 Years Old, Male ?Preventive care refers to lifestyle choices and visits with your health care provider that can promote health and wellness. Preventive care visits are also called wellness exams. ?What can I expect for my preventive care visit? ?Counseling ?During your preventive care visit, your health care provider may ask about your: ?Medical history, including: ?Past medical problems. ?Family medical history. ?Current health, including: ?Emotional well-being. ?Home life and relationship well-being. ?Sexual activity. ?Lifestyle, including: ?Alcohol, nicotine or tobacco, and drug use. ?Access to firearms. ?Diet, exercise, and sleep habits. ?Safety issues such as seatbelt and bike helmet use. ?Sunscreen use. ?Work and work environment. ?Physical exam ?Your health care provider may check your: ?Height and weight. These may be used to calculate your BMI (body mass index). BMI is a measurement that tells if you are at a healthy weight. ?Waist circumference. This measures the distance around your waistline. This measurement also tells if you are at a healthy weight and may help predict your risk of certain diseases, such as type 2 diabetes and high blood pressure. ?Heart rate and blood pressure. ?Body temperature. ?Skin for abnormal spots. ?What immunizations do I need? ?Vaccines are usually given at various ages, according to a schedule. Your health care provider will recommend vaccines for you based on your age, medical history, and lifestyle or other factors, such as travel or where you work. ?What tests do I need? ?Screening ?Your health care provider may recommend screening tests for certain conditions. This may include: ?Lipid and cholesterol levels. ?Diabetes screening. This is done by checking your blood sugar (glucose) after you have not eaten for a while (fasting). ?Hepatitis B test. ?Hepatitis C test. ?HIV (human immunodeficiency virus) test. ?STI (sexually transmitted infection)  testing, if you are at risk. ?Talk with your health care provider about your test results, treatment options, and if necessary, the need for more tests. ?Follow these instructions at home: ?Eating and drinking ? ?Eat a healthy diet that includes fresh fruits and vegetables, whole grains, lean protein, and low-fat dairy products. ?Drink enough fluid to keep your urine pale yellow. ?Take vitamin and mineral supplements as recommended by your health care provider. ?Do not drink alcohol if your health care provider tells you not to drink. ?If you drink alcohol: ?Limit how much you have to 0-2 drinks a day. ?Know how much alcohol is in your drink. In the [place] one drink equals one 12 oz bottle of beer (355 mL), one 5 oz glass of wine (148 mL), or one 1? oz glass of hard liquor (44 mL). ?Lifestyle ?Brush your teeth every morning and night with fluoride toothpaste. Floss one time each day. ?Exercise for at least 30 minutes 5 or more days each week. ?Do not use any products that contain nicotine or tobacco. These products include cigarettes, chewing tobacco, and vaping devices, such as e-cigarettes. If you need help quitting, ask your health care provider. ?Do not use drugs. ?If you are sexually active, practice safe sex. Use a condom or other form of protection to prevent STIs. ?Find healthy ways to manage stress, such as: ?Meditation, yoga, or listening to music. ?Journaling. ?Talking to a trusted person. ?Spending time with friends and family. ?Minimize exposure to UV radiation to reduce your risk of skin cancer. ?Safety ?Always wear your seat belt while driving or riding in a vehicle. ?Do not drive: ?If you have been drinking alcohol. Do not ride with someone who has been drinking. ?If you have been using any mind-altering substances or   drugs. ?While texting. ?When you are tired or distracted. ?Wear a helmet and other protective equipment during sports activities. ?If you have firearms in your house, make sure you  follow all gun safety procedures. ?Seek help if you have been physically or sexually abused. ?What's next? ?Go to your health care provider once a year for an annual wellness visit. ?Ask your health care provider how often you should have your eyes and teeth checked. ?Stay up to date on all vaccines. ?This information is not intended to replace advice given to you by your health care provider. Make sure you discuss any questions you have with your health care provider. ?Document Revised: 12/16/2020 Document Reviewed: 12/16/2020 ?Elsevier Patient Education ? 2022 Elsevier Inc. ? ?

## 2021-09-29 NOTE — Progress Notes (Signed)
HPI: ?Ryan Hubbard is a 36 y.o. male who  has a past medical history of Essential hypertension, benign (07/08/2013), Ganglion cyst (05/28/2013), Right supraspinatus tendinitis (05/31/2016), and Thumb pain, left (12/27/2016). ? ?he presents [facility] today, 09/29/21,  ?for chief complaint of: ?Annual physical exam ? ?Dentist: UTD ?Eye exam: UTD, contacts/glasses ?Exercise: Regular intentional exercise several days weekly ?Diet: Room for improvement ?COVID vaccine: Done ? ?Concerns: ?None ? ?Past medical, surgical, social and family history reviewed: ? ?Patient Active Problem List  ? Diagnosis Date Noted  ? Chronic low back pain 11/03/2020  ? Elevated fasting glucose 04/18/2017  ? Seasonal allergies 11/20/2013  ? Essential hypertension, benign 07/08/2013  ? ? ?No past surgical history on file. ? ?Social History  ? ?Tobacco Use  ? Smoking status: Never  ? Smokeless tobacco: Never  ?Substance Use Topics  ? Alcohol use: Yes  ?  Alcohol/week: 2.0 standard drinks  ?  Types: 2 Shots of liquor per week  ? ? ?Family History  ?Problem Relation Age of Onset  ? Cancer Mother   ? Diabetes Mother   ?  ? ?Current medication list and allergy/intolerance information reviewed:   ? ?Current Outpatient Medications  ?Medication Sig Dispense Refill  ? amitriptyline (ELAVIL) 50 MG tablet TAKE 1 TABLET (50MG ) BY MOUTH AT BEDTIME. 90 tablet 1  ? fluticasone (FLONASE) 50 MCG/ACT nasal spray Place 1-2 sprays into both nostrils daily. 48 g 4  ? olmesartan (BENICAR) 40 MG tablet Take 1 tablet (40 mg total) by mouth daily. 90 tablet 3  ? Omega-3 Fatty Acids (FISH OIL) 1000 MG CPDR Take 2 capsules by mouth daily.    ? sertraline (ZOLOFT) 25 MG tablet Take 1 tablet (25 mg total) by mouth daily. 90 tablet 1  ? ?No current facility-administered medications for this visit.  ? ? ?Allergies  ?Allergen Reactions  ? Atrovent [Ipratropium]   ?  Throat pain  ? ?  ? ?Review of Systems: ?Constitutional:  No   fever, no chills, No recent illness, No unintentional weight changes. No significant fatigue.  ?HEENT: No  headache, no vision change, no hearing change, No sore throat, No  sinus pressure ?Cardiac: No  chest pain, No  pressure, No palpitations, No  Orthopnea ?Respiratory:  No  shortness of breath. No  Cough ?Gastrointestinal: No  abdominal pain, No  nausea, No  vomiting,  No  blood in stool, No  diarrhea, No  constipation  ?Musculoskeletal: No new myalgia/arthralgia ?Skin: No  Rash, No other wounds/concerning lesions ?Genitourinary: No  incontinence, No  abnormal genital bleeding, No abnormal genital discharge ?Hem/Onc: No  easy bruising/bleeding, No  abnormal lymph node ?Endocrine: No cold intolerance,  No heat intolerance. No polyuria/polydipsia/polyphagia  ?Neurologic: No  weakness, No  dizziness, No  slurred speech/focal weakness/facial droop ?Psychiatric: No  concerns with depression, No  concerns with anxiety, No sleep problems, No mood problems ? ?Exam:  ?BP 129/87   Pulse 74   Ht 6' (1.829 m)   Wt 205 lb (93 kg)   SpO2 99%   BMI 27.80 kg/m?  ?Constitutional: VS see above. General Appearance: alert, well-developed, well-nourished, NAD ?Eyes: Normal lids and conjunctive, non-icteric sclera ?Ears, Nose, Mouth, Throat: MMM, Normal external inspection ears/nares/mouth/lips/gums. TM normal bilaterally. Pharynx/tonsils no erythema, no exudate. Nasal mucosa normal.  ?Neck: No masses, trachea midline. No thyroid enlargement. No tenderness/mass appreciated. No lymphadenopathy ?Respiratory: Normal respiratory effort. no wheeze, no rhonchi, no rales ?Cardiovascular: S1/S2 normal, no murmur, no rub/gallop  auscultated. RRR. No lower extremity edema. Pedal pulse II/IV bilaterally PT. No carotid bruit or JVD. No abdominal aortic bruit. ?Gastrointestinal: Nontender, no masses. No hepatomegaly, no splenomegaly. No hernia appreciated. Bowel sounds normal. Rectal exam deferred.  ?Musculoskeletal: Gait normal. No  clubbing/cyanosis of digits.  ?Neurological: Normal balance/coordination. No tremor. No cranial nerve deficit on limited exam. Motor and sensation intact and symmetric. Cerebellar reflexes intact.  ?Skin: warm, dry, intact. No rash/ulcer. No concerning nevi or subq nodules on limited exam.   ?Psychiatric: Normal judgment/insight. Normal mood and affect. Oriented x3.  ? ? ?ASSESSMENT/PLAN:  ? ?1. Annual physical exam ?Checking labs as below.  Up-to-date on wellness/preventative care. ?- TSH ?- Lipid Panel w/reflex Direct LDL ?- COMPLETE METABOLIC PANEL WITH GFR ?- CBC with Differential/Platelet ? ?2. Elevated fasting glucose ?Checking labs as below. ?- COMPLETE METABOLIC PANEL WITH GFR ?- Hemoglobin A1c ? ?3. Lipid screening ?Checking lipid panel. ?- Lipid Panel w/reflex Direct LDL ? ?4. Thyroid disorder screen ?Checking TSH. ?- TSH ? ?5. Essential hypertension, benign ?Checking labs.  Blood pressure at goal on recheck.  Continue olmesartan 40 mg daily. ?- COMPLETE METABOLIC PANEL WITH GFR ? ?6. Anxiety and depression ?7. Other insomnia ?Stable, controlled.  Continue sertraline 25 mg daily and amitriptyline 50 mg nightly. ? ?Orders Placed This Encounter  ?Procedures  ? TSH  ? Lipid Panel w/reflex Direct LDL  ? COMPLETE METABOLIC PANEL WITH GFR  ? CBC with Differential/Platelet  ? Hemoglobin A1c  ? ? ?No orders of the defined types were placed in this encounter. ? ? ?Patient Instructions  ?Preventive Care 63-62 Years Old, Male ?Preventive care refers to lifestyle choices and visits with your health care provider that can promote health and wellness. Preventive care visits are also called wellness exams. ?What can I expect for my preventive care visit? ?Counseling ?During your preventive care visit, your health care provider may ask about your: ?Medical history, including: ?Past medical problems. ?Family medical history. ?Current health, including: ?Emotional well-being. ?Home life and relationship well-being. ?Sexual  activity. ?Lifestyle, including: ?Alcohol, nicotine or tobacco, and drug use. ?Access to firearms. ?Diet, exercise, and sleep habits. ?Safety issues such as seatbelt and bike helmet use. ?Sunscreen use. ?Work and work Astronomer. ?Physical exam ?Your health care provider may check your: ?Height and weight. These may be used to calculate your BMI (body mass index). BMI is a measurement that tells if you are at a healthy weight. ?Waist circumference. This measures the distance around your waistline. This measurement also tells if you are at a healthy weight and may help predict your risk of certain diseases, such as type 2 diabetes and high blood pressure. ?Heart rate and blood pressure. ?Body temperature. ?Skin for abnormal spots. ?What immunizations do I need? ?Vaccines are usually given at various ages, according to a schedule. Your health care provider will recommend vaccines for you based on your age, medical history, and lifestyle or other factors, such as travel or where you work. ?What tests do I need? ?Screening ?Your health care provider may recommend screening tests for certain conditions. This may include: ?Lipid and cholesterol levels. ?Diabetes screening. This is done by checking your blood sugar (glucose) after you have not eaten for a while (fasting). ?Hepatitis B test. ?Hepatitis C test. ?HIV (human immunodeficiency virus) test. ?STI (sexually transmitted infection) testing, if you are at risk. ?Talk with your health care provider about your test results, treatment options, and if necessary, the need for more tests. ?Follow these instructions at home: ?Eating  and drinking ? ?Eat a healthy diet that includes fresh fruits and vegetables, whole grains, lean protein, and low-fat dairy products. ?Drink enough fluid to keep your urine pale yellow. ?Take vitamin and mineral supplements as recommended by your health care provider. ?Do not drink alcohol if your health care provider tells you not to drink. ?If  you drink alcohol: ?Limit how much you have to 0-2 drinks a day. ?Know how much alcohol is in your drink. In the [place] one drink equals one 12 oz bottle of beer (355 mL), one 5 oz glass of wine (148 mL), or one 1?

## 2021-09-30 LAB — CBC WITH DIFFERENTIAL/PLATELET
Absolute Monocytes: 497 cells/uL (ref 200–950)
Basophils Absolute: 28 cells/uL (ref 0–200)
Basophils Relative: 0.4 %
Eosinophils Absolute: 28 cells/uL (ref 15–500)
Eosinophils Relative: 0.4 %
HCT: 45.3 % (ref 38.5–50.0)
Hemoglobin: 15.2 g/dL (ref 13.2–17.1)
Lymphs Abs: 3358 cells/uL (ref 850–3900)
MCH: 32.5 pg (ref 27.0–33.0)
MCHC: 33.6 g/dL (ref 32.0–36.0)
MCV: 96.8 fL (ref 80.0–100.0)
MPV: 11.6 fL (ref 7.5–12.5)
Monocytes Relative: 7 %
Neutro Abs: 3188 cells/uL (ref 1500–7800)
Neutrophils Relative %: 44.9 %
Platelets: 287 10*3/uL (ref 140–400)
RBC: 4.68 10*6/uL (ref 4.20–5.80)
RDW: 12.7 % (ref 11.0–15.0)
Total Lymphocyte: 47.3 %
WBC: 7.1 10*3/uL (ref 3.8–10.8)

## 2021-09-30 LAB — LIPID PANEL W/REFLEX DIRECT LDL
Cholesterol: 216 mg/dL — ABNORMAL HIGH (ref <200)
HDL: 82 mg/dL (ref >=40)
LDL Cholesterol (Calc): 118 mg/dL (calc) — ABNORMAL HIGH
Non-HDL Cholesterol (Calc): 134 mg/dL (calc) — ABNORMAL HIGH (ref <130)
Total CHOL/HDL Ratio: 2.6 (calc) (ref <5.0)
Triglycerides: 72 mg/dL (ref <150)

## 2021-09-30 LAB — COMPLETE METABOLIC PANEL WITH GFR
AG Ratio: 2.4 (calc) (ref 1.0–2.5)
ALT: 40 U/L (ref 9–46)
AST: 23 U/L (ref 10–40)
Albumin: 5.2 g/dL — ABNORMAL HIGH (ref 3.6–5.1)
Alkaline phosphatase (APISO): 76 U/L (ref 36–130)
BUN: 13 mg/dL (ref 7–25)
CO2: 27 mmol/L (ref 20–32)
Calcium: 10 mg/dL (ref 8.6–10.3)
Chloride: 105 mmol/L (ref 98–110)
Creat: 1.07 mg/dL (ref 0.60–1.26)
Globulin: 2.2 g/dL (calc) (ref 1.9–3.7)
Glucose, Bld: 112 mg/dL — ABNORMAL HIGH (ref 65–99)
Potassium: 4.8 mmol/L (ref 3.5–5.3)
Sodium: 141 mmol/L (ref 135–146)
Total Bilirubin: 0.8 mg/dL (ref 0.2–1.2)
Total Protein: 7.4 g/dL (ref 6.1–8.1)
eGFR: 93 mL/min/{1.73_m2} (ref >=60)

## 2021-09-30 LAB — TSH: TSH: 0.95 mIU/L (ref 0.40–4.50)

## 2021-09-30 LAB — HEMOGLOBIN A1C
Hgb A1c MFr Bld: 5.6 % of total Hgb (ref <5.7)
Mean Plasma Glucose: 114 mg/dL
eAG (mmol/L): 6.3 mmol/L

## 2021-12-31 IMAGING — DX DG LUMBAR SPINE COMPLETE 4+V
5 series · 5 of 5 positions shown · non-contrast
Comparison: None.

CLINICAL DATA: Chronic low back pain for the past year.

EXAM:
LUMBAR SPINE - COMPLETE 4+ VIEW

[l-spine ap]
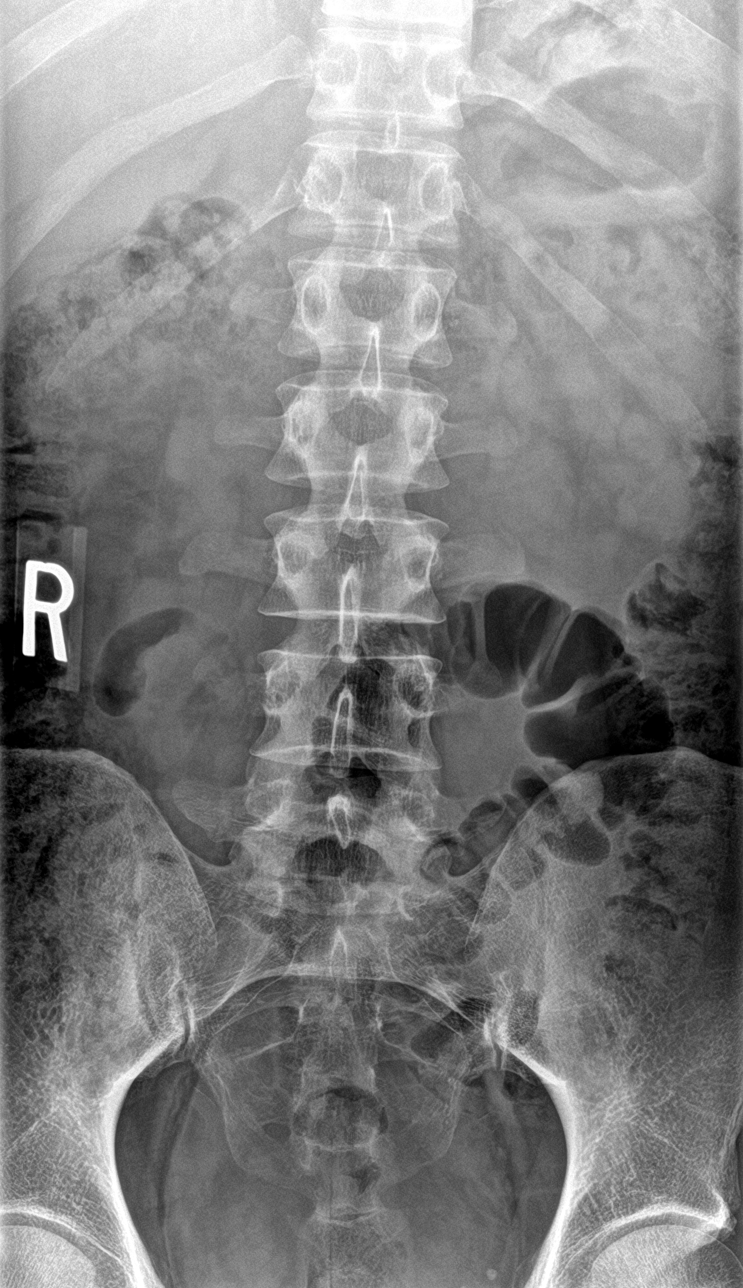

[l-spine obl (1 of 2)]
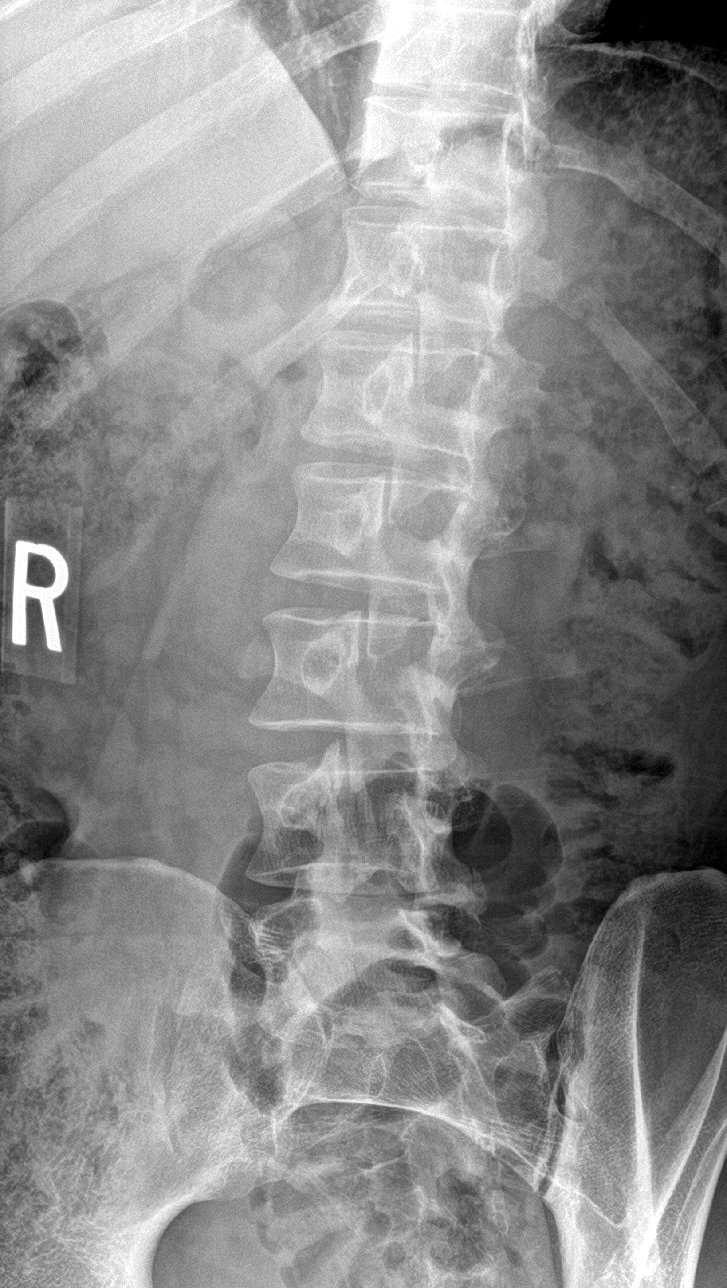

[l-spine obl (2 of 2)]
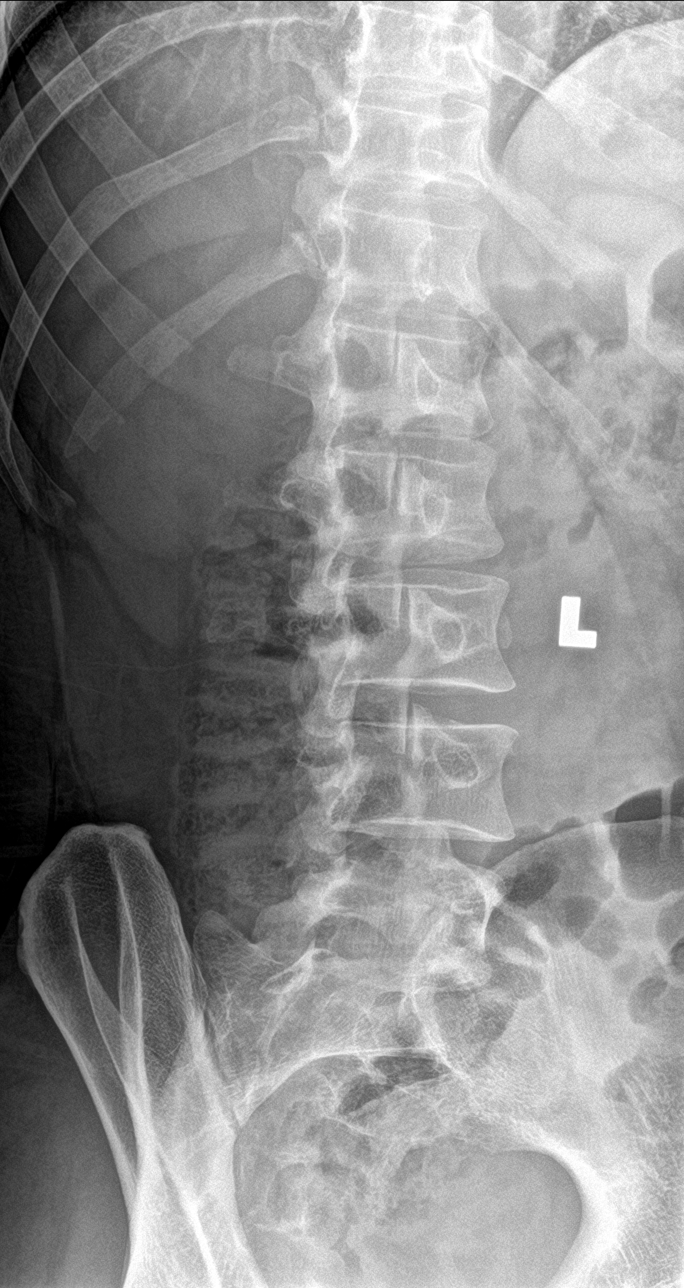

[l-spine lat]
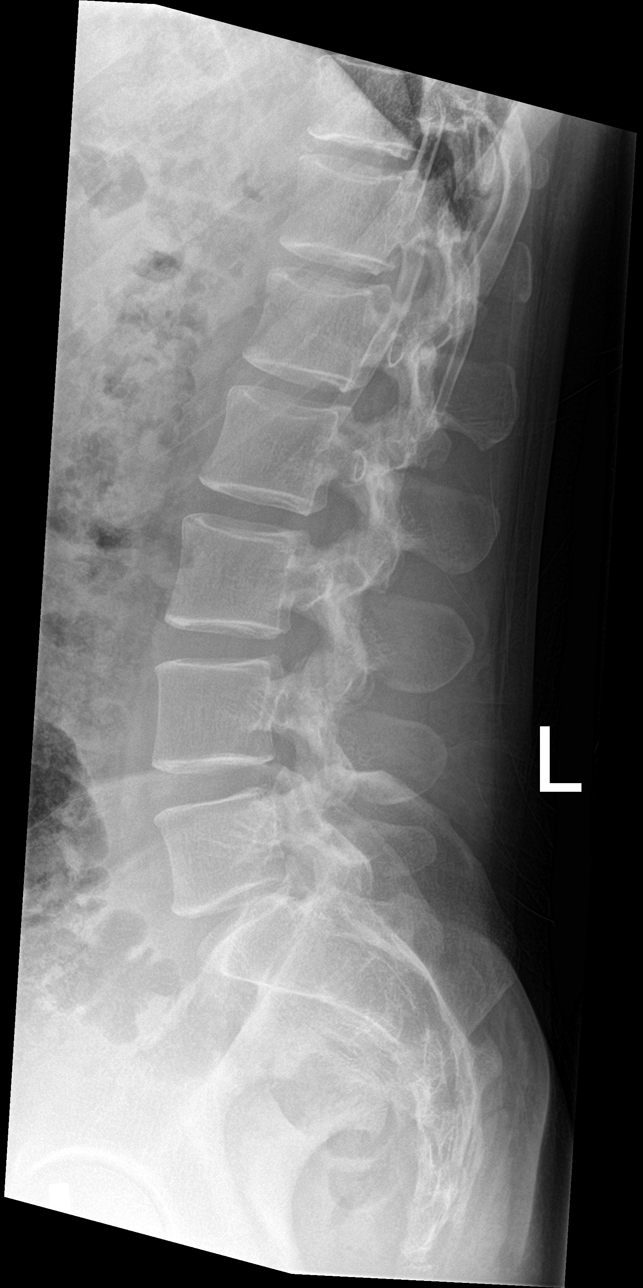

[l-spine spot]
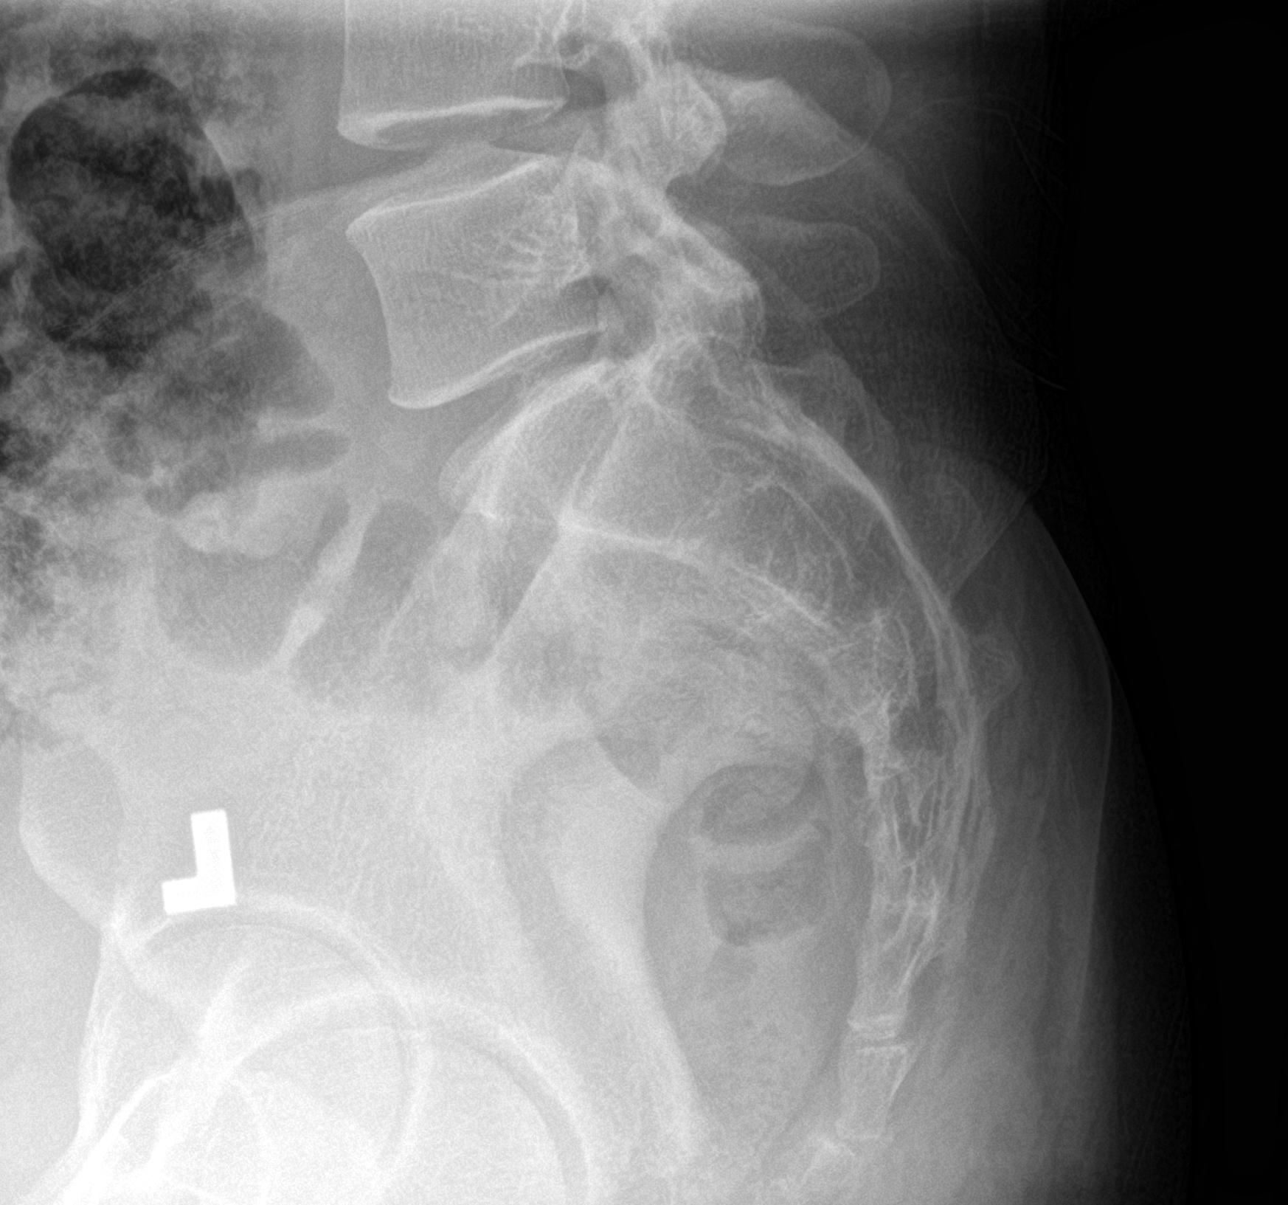

[5 of 5 positions shown; findings below may reference images not displayed]

FINDINGS: Five lumbar type vertebral bodies.

No acute fracture or subluxation. Vertebral body heights are
preserved.

Alignment is normal.

Intervertebral disc spaces are maintained.

The sacroiliac joints are unremarkable.
IMPRESSION: Negative.

## 2022-01-19 ENCOUNTER — Other Ambulatory Visit: Payer: Self-pay | Admitting: Osteopathic Medicine

## 2022-01-19 DIAGNOSIS — I10 Essential (primary) hypertension: Secondary | ICD-10-CM

## 2022-01-28 ENCOUNTER — Other Ambulatory Visit: Payer: Self-pay | Admitting: Medical-Surgical

## 2022-01-28 DIAGNOSIS — F419 Anxiety disorder, unspecified: Secondary | ICD-10-CM

## 2022-02-04 ENCOUNTER — Other Ambulatory Visit: Payer: Self-pay | Admitting: Medical-Surgical

## 2022-02-04 DIAGNOSIS — G4709 Other insomnia: Secondary | ICD-10-CM

## 2022-02-04 DIAGNOSIS — F419 Anxiety disorder, unspecified: Secondary | ICD-10-CM

## 2022-02-07 ENCOUNTER — Other Ambulatory Visit: Payer: Self-pay | Admitting: Medical-Surgical

## 2022-02-07 DIAGNOSIS — F419 Anxiety disorder, unspecified: Secondary | ICD-10-CM

## 2022-04-01 ENCOUNTER — Ambulatory Visit (INDEPENDENT_AMBULATORY_CARE_PROVIDER_SITE_OTHER): Payer: BC Managed Care – PPO | Admitting: Medical-Surgical

## 2022-04-01 ENCOUNTER — Encounter: Payer: Self-pay | Admitting: Medical-Surgical

## 2022-04-01 VITALS — BP 136/81 | HR 91 | Resp 20 | Ht 72.0 in | Wt 218.1 lb

## 2022-04-01 DIAGNOSIS — Z23 Encounter for immunization: Secondary | ICD-10-CM | POA: Diagnosis not present

## 2022-04-01 DIAGNOSIS — I10 Essential (primary) hypertension: Secondary | ICD-10-CM

## 2022-04-01 DIAGNOSIS — F419 Anxiety disorder, unspecified: Secondary | ICD-10-CM | POA: Diagnosis not present

## 2022-04-01 DIAGNOSIS — F32A Depression, unspecified: Secondary | ICD-10-CM

## 2022-04-01 DIAGNOSIS — G4709 Other insomnia: Secondary | ICD-10-CM | POA: Diagnosis not present

## 2022-04-01 NOTE — Progress Notes (Signed)
Medical screening examination/treatment was performed by qualified nurse practitioner student and as supervising provider I was immediately available for consultation/collaboration. I have reviewed documentation and agree with assessment and plan. ° °Paco Cislo L. Allecia Bells, DNP, APRN, FNP-BC °[facility] °Primary Care and Sports Medicine ° °

## 2022-04-01 NOTE — Progress Notes (Signed)
   Established Patient Office Visit  Subjective   Patient ID: Ryan Hubbard, male    DOB: [birth year]  Age: 36 y.o. MRN: [id number]  Chief Complaint  Patient presents with   Follow-up   Hypertension    HPI Medication: Currently on Olmesartan 40 mg, daily.   Compliant: Yes Side effects: he denies any side effect with this medication. Checking BP at home: sometimes, readings he states that it seems to be relatively accurate Low sodium diet: He state he does not add salt to his food Exercise: He reports 4-5 times per week. Concerning symptoms: He reports no chest pain, dizziness or palpitations. He did voiced concerns regarding some weight gain.     Review of Systems  Constitutional: Negative.        Endorsed weigh gain  Respiratory: Negative.    Cardiovascular: Negative.   Neurological: Negative.       Objective:     BP 136/81 (BP Location: Left Arm, Cuff Size: Normal)   Pulse 91   Resp 20   Ht 6' (1.829 m)   Wt 98.9 kg   SpO2 99%   BMI 29.58 kg/m    Physical Exam Constitutional:      General: He is not in acute distress.    Appearance: Normal appearance. He is not ill-appearing, toxic-appearing or diaphoretic.  HENT:     Head: Normocephalic and atraumatic.  Cardiovascular:     Rate and Rhythm: Normal rate and regular rhythm.  Pulmonary:     Effort: Pulmonary effort is normal.  Skin:    General: Skin is warm and dry.  Neurological:     General: No focal deficit present.     Mental Status: He is alert and oriented to person, place, and time. Mental status is at baseline.  Psychiatric:        Mood and Affect: Mood normal.        Behavior: Behavior normal.        Thought Content: Thought content normal.      No results found for any visits on 04/01/22.    The ASCVD Risk score (Arnett DK, et al., 2019) failed to calculate for the following reasons:   The 2019 ASCVD risk score is only valid for ages 78 to 89    Assessment & Plan:    1.  Essential hypertension, benign Continue current dose of of Benicar 40 mg daily. He reports not adding salt to his food but could do better with food choices on some days. We educated him on importance of a balanced diet. He reports consistent exercise routine so his weight gain is not a big issue at this time.   2. Need for influenza vaccination We will administer influenza vaccine at the clinic today.  - Flu Vaccine QUAD 6+ mos PF IM (Fluarix Quad PF)  3. Anxiety and depression 4. Other insomnia Ryan Hubbard stated that his anxiety and depression are very much controlled lately and he feels so much better than he did last December-January. He inquired about discontinuing the Elavil. We advised him on how to safely titrate this medication and how to restart it if he's not able to tolerate it. He reports sleeping habits have improved. Also discussed slow taper of Sertraline to discontinue if desired.   Return for annual physical exam at your convenience.    Ralph Leyden, RN Student-NP

## 2022-04-22 ENCOUNTER — Other Ambulatory Visit: Payer: Self-pay

## 2022-04-22 DIAGNOSIS — I10 Essential (primary) hypertension: Secondary | ICD-10-CM

## 2022-04-22 MED ORDER — OLMESARTAN MEDOXOMIL 40 MG PO TABS: 40.0000 mg | ORAL_TABLET | Freq: Every day | ORAL | 0 refills | Status: DC

## 2022-07-18 ENCOUNTER — Other Ambulatory Visit: Payer: Self-pay | Admitting: Medical-Surgical

## 2022-07-18 DIAGNOSIS — I10 Essential (primary) hypertension: Secondary | ICD-10-CM

## 2022-07-23 ENCOUNTER — Other Ambulatory Visit: Payer: Self-pay | Admitting: Medical-Surgical

## 2022-07-23 DIAGNOSIS — F32A Depression, unspecified: Secondary | ICD-10-CM

## 2022-08-03 ENCOUNTER — Ambulatory Visit (INDEPENDENT_AMBULATORY_CARE_PROVIDER_SITE_OTHER): Payer: BC Managed Care – PPO | Admitting: Medical-Surgical

## 2022-08-03 ENCOUNTER — Encounter: Payer: Self-pay | Admitting: Medical-Surgical

## 2022-08-03 VITALS — BP 124/60 | HR 71 | Resp 20 | Ht 72.0 in | Wt 218.0 lb

## 2022-08-03 DIAGNOSIS — Z13220 Encounter for screening for lipoid disorders: Secondary | ICD-10-CM

## 2022-08-03 DIAGNOSIS — F32A Depression, unspecified: Secondary | ICD-10-CM | POA: Diagnosis not present

## 2022-08-03 DIAGNOSIS — F419 Anxiety disorder, unspecified: Secondary | ICD-10-CM

## 2022-08-03 DIAGNOSIS — Z0000 Encounter for general adult medical examination without abnormal findings: Secondary | ICD-10-CM | POA: Diagnosis not present

## 2022-08-03 MED ORDER — HYDROXYZINE HCL 50 MG PO TABS: 50.0000 mg | ORAL_TABLET | Freq: Three times a day (TID) | ORAL | 3 refills | Status: DC | PRN

## 2022-08-03 NOTE — Progress Notes (Signed)
Complete physical exam  Patient: Ryan Hubbard   DOB: 08/05/1985   37 y.o. Male  MRN: [id number]  Subjective:    Chief Complaint  Patient presents with   Annual Exam    Ryan Hubbard is a 37 y.o. male who presents today for a complete physical exam. He reports consuming a general diet. Home exercise routine includes Peloton, treadmill, strength training, and running. He generally feels well. He reports sleeping fairly well. He does not have additional problems to discuss today.    Most recent fall risk assessment:    08/03/2022    8:51 AM  [place] in the past year? 0  Number falls in past yr: 0  Injury with Fall? 0  Risk for fall due to : No Fall Risks  Follow up Falls evaluation completed     Most recent depression screenings:    08/03/2022    8:51 AM 04/01/2022    8:14 AM  PHQ 2/9 Scores  PHQ - 2 Score 0 0    Vision:Within last year, Dental: No current dental problems and Receives regular dental care, and STD: The patient denies history of sexually transmitted disease.    Patient Care Team: Samuel Bouche, NP as PCP - General (Nurse Practitioner) Silverio Decamp, MD as Consulting Physician (Sports Medicine)   Outpatient Medications Prior to Visit  Medication Sig   fluticasone (FLONASE) 50 MCG/ACT nasal spray Place 1-2 sprays into both nostrils daily.   olmesartan (BENICAR) 40 MG tablet TAKE 1 TABLET BY MOUTH DAILY   Omega-3 Fatty Acids (FISH OIL) 1000 MG CPDR Take 2 capsules by mouth daily.   sertraline (ZOLOFT) 25 MG tablet TAKE 1 TABLET BY MOUTH DAILY (Patient taking differently: Take 12.5 mg by mouth daily.)   [DISCONTINUED] amitriptyline (ELAVIL) 50 MG tablet TAKE ONE TABLET BY MOUTH EVERY NIGHT AT BEDTIME (Patient not taking: Reported on 08/03/2022)   No facility-administered medications prior to visit.    Review of Systems  Constitutional:  Negative for chills, fever, malaise/fatigue and weight loss.  HENT:  Negative  for congestion, ear pain, hearing loss, sinus pain and sore throat.   Eyes:  Negative for blurred vision, photophobia and pain.  Respiratory:  Negative for cough, shortness of breath and wheezing.   Cardiovascular:  Negative for chest pain, palpitations and leg swelling.  Gastrointestinal:  Negative for abdominal pain, constipation, diarrhea, heartburn, nausea and vomiting.  Genitourinary:  Negative for dysuria, frequency and urgency.  Musculoskeletal:  Negative for falls and neck pain.  Skin:  Negative for itching and rash.  Neurological:  Negative for dizziness, weakness and headaches.  Endo/Heme/Allergies:  Negative for polydipsia. Does not bruise/bleed easily.  Psychiatric/Behavioral:  Negative for depression, substance abuse and suicidal ideas. The patient has insomnia (occasional). The patient is not nervous/anxious.      Objective:    BP 124/60 (BP Location: Left Arm, Cuff Size: Large)   Pulse 71   Resp 20   Ht 6' (1.829 m)   Wt 218 lb 0.6 oz (98.9 kg)   SpO2 97%   BMI 29.57 kg/m    Physical Exam Constitutional:      General: He is not in acute distress.    Appearance: Normal appearance. He is not ill-appearing.  HENT:     Head: Normocephalic and atraumatic.     Right Ear: Tympanic membrane, ear canal and external ear normal. There is no impacted cerumen.     Left Ear: Tympanic membrane, ear canal and  external ear normal. There is no impacted cerumen.     Nose: Nose normal. No congestion or rhinorrhea.     Mouth/Throat:     Mouth: Mucous membranes are moist.     Pharynx: No oropharyngeal exudate or posterior oropharyngeal erythema.  Eyes:     General: No scleral icterus.       Right eye: No discharge.        Left eye: No discharge.     Extraocular Movements: Extraocular movements intact.     Conjunctiva/sclera: Conjunctivae normal.     Pupils: Pupils are equal, round, and reactive to light.  Neck:     Thyroid: No thyromegaly.     Vascular: No carotid bruit or JVD.      Trachea: Trachea normal.  Cardiovascular:     Rate and Rhythm: Normal rate and regular rhythm.     Pulses: Normal pulses.     Heart sounds: Normal heart sounds. No murmur heard.    No friction rub. No gallop.  Pulmonary:     Effort: Pulmonary effort is normal. No respiratory distress.     Breath sounds: Normal breath sounds. No wheezing.  Abdominal:     General: Bowel sounds are normal. There is no distension.     Palpations: Abdomen is soft.     Tenderness: There is no abdominal tenderness. There is no guarding.  Musculoskeletal:        General: Normal range of motion.     Cervical back: Normal range of motion and neck supple.  Lymphadenopathy:     Cervical: No cervical adenopathy.  Skin:    General: Skin is warm and dry.  Neurological:     Mental Status: He is alert and oriented to person, place, and time.     Cranial Nerves: No cranial nerve deficit.  Psychiatric:        Mood and Affect: Mood normal.        Behavior: Behavior normal.        Thought Content: Thought content normal.        Judgment: Judgment normal.   No results found for any visits on 08/03/22.     Assessment & Plan:    Routine Health Maintenance and Physical Exam  Immunization History  Administered Date(s) Administered   Hepatitis B 03/19/1997, 05/17/1997, 09/24/1997   Influenza,inj,Quad PF,6+ Mos 08/15/2018, 05/04/2021, 04/01/2022   Moderna Sars-Covid-2 Vaccination 06/09/2020   PFIZER(Purple Top)SARS-COV-2 Vaccination 09/27/2019, 10/18/2019, 05/04/2021   Tdap 09/10/2012    Health Maintenance  Topic Date Due   Hepatitis C Screening  10/01/2022 (Originally 01/24/2004)   HIV Screening  10/01/2022 (Originally 01/23/2001)   DTaP/Tdap/Td (2 - Td or Tdap) 09/11/2022   INFLUENZA VACCINE  Completed   HPV VACCINES  Aged Out   COVID-19 Vaccine  Discontinued    Discussed health benefits of physical activity, and encouraged him to engage in regular exercise appropriate for his age and condition.  1.  Anxiety and depression Continue sertraline 12.5 mg daily.  Okay to use hydroxyzine 50 mg nightly as needed.  Refill sent. - hydrOXYzine (ATARAX) 50 MG tablet; Take 1 tablet (50 mg total) by mouth 3 (three) times daily as needed. for anxiety  Dispense: 90 tablet; Refill: 3  2. Annual physical exam Checking labs as below.  Up-to-date on preventative care.  Wellness information provided with AVS. - Lipid panel - COMPLETE METABOLIC PANEL WITH GFR - CBC with Differential/Platelet  3. Lipid screening Checking lipid panel today. - Lipid panel   Return in  about 1 year (around 08/04/2023) for annual physical exam or sooner if needed.     Samuel Bouche, NP

## 2022-08-04 LAB — COMPLETE METABOLIC PANEL WITH GFR
AG Ratio: 2.5 (calc) (ref 1.0–2.5)
ALT: 28 U/L (ref 9–46)
AST: 17 U/L (ref 10–40)
Albumin: 5 g/dL (ref 3.6–5.1)
Alkaline phosphatase (APISO): 64 U/L (ref 36–130)
BUN: 16 mg/dL (ref 7–25)
CO2: 26 mmol/L (ref 20–32)
Calcium: 10 mg/dL (ref 8.6–10.3)
Chloride: 103 mmol/L (ref 98–110)
Creat: 0.98 mg/dL (ref 0.60–1.26)
Globulin: 2 g/dL (calc) (ref 1.9–3.7)
Glucose, Bld: 100 mg/dL — ABNORMAL HIGH (ref 65–99)
Potassium: 4.6 mmol/L (ref 3.5–5.3)
Sodium: 139 mmol/L (ref 135–146)
Total Bilirubin: 0.9 mg/dL (ref 0.2–1.2)
Total Protein: 7 g/dL (ref 6.1–8.1)
eGFR: 102 mL/min/{1.73_m2} (ref >=60)

## 2022-08-04 LAB — CBC WITH DIFFERENTIAL/PLATELET
Absolute Monocytes: 540 cells/uL (ref 200–950)
Basophils Absolute: 58 cells/uL (ref 0–200)
Basophils Relative: 0.8 %
Eosinophils Absolute: 314 cells/uL (ref 15–500)
Eosinophils Relative: 4.3 %
HCT: 42.8 % (ref 38.5–50.0)
Hemoglobin: 14.5 g/dL (ref 13.2–17.1)
Lymphs Abs: 3256 cells/uL (ref 850–3900)
MCH: 32.1 pg (ref 27.0–33.0)
MCHC: 33.9 g/dL (ref 32.0–36.0)
MCV: 94.7 fL (ref 80.0–100.0)
MPV: 11.5 fL (ref 7.5–12.5)
Monocytes Relative: 7.4 %
Neutro Abs: 3132 cells/uL (ref 1500–7800)
Neutrophils Relative %: 42.9 %
Platelets: 329 10*3/uL (ref 140–400)
RBC: 4.52 10*6/uL (ref 4.20–5.80)
RDW: 11.9 % (ref 11.0–15.0)
Total Lymphocyte: 44.6 %
WBC: 7.3 10*3/uL (ref 3.8–10.8)

## 2022-08-04 LAB — LIPID PANEL
Cholesterol: 191 mg/dL (ref <200)
HDL: 58 mg/dL (ref >=40)
LDL Cholesterol (Calc): 112 mg/dL (calc) — ABNORMAL HIGH
Non-HDL Cholesterol (Calc): 133 mg/dL (calc) — ABNORMAL HIGH (ref <130)
Total CHOL/HDL Ratio: 3.3 (calc) (ref <5.0)
Triglycerides: 105 mg/dL (ref <150)

## 2022-08-17 ENCOUNTER — Ambulatory Visit (INDEPENDENT_AMBULATORY_CARE_PROVIDER_SITE_OTHER): Payer: BC Managed Care – PPO | Admitting: Medical-Surgical

## 2022-08-17 ENCOUNTER — Encounter: Payer: Self-pay | Admitting: Medical-Surgical

## 2022-08-17 ENCOUNTER — Other Ambulatory Visit: Payer: Self-pay | Admitting: Medical-Surgical

## 2022-08-17 VITALS — BP 123/82 | HR 81 | Resp 20 | Ht 72.0 in | Wt 217.7 lb

## 2022-08-17 DIAGNOSIS — J101 Influenza due to other identified influenza virus with other respiratory manifestations: Secondary | ICD-10-CM | POA: Diagnosis not present

## 2022-08-17 DIAGNOSIS — I10 Essential (primary) hypertension: Secondary | ICD-10-CM

## 2022-08-17 DIAGNOSIS — R0981 Nasal congestion: Secondary | ICD-10-CM

## 2022-08-17 DIAGNOSIS — J029 Acute pharyngitis, unspecified: Secondary | ICD-10-CM

## 2022-08-17 LAB — POCT INFLUENZA A/B
Influenza A, POC: NEGATIVE
Influenza B, POC: POSITIVE — AB

## 2022-08-17 LAB — POC COVID19 BINAXNOW: SARS Coronavirus 2 Ag: NEGATIVE

## 2022-08-17 LAB — POCT RAPID STREP A (OFFICE): Rapid Strep A Screen: NEGATIVE

## 2022-08-17 MED ORDER — AZITHROMYCIN 250 MG PO TABS: ORAL_TABLET | ORAL | 0 refills | Status: AC

## 2022-08-17 NOTE — Progress Notes (Signed)
Established Patient Office Visit  Subjective   Patient ID: Izel Deakins, male   DOB: Jun 24, 1986 Age: 37 y.o. MRN: [id number]   Chief Complaint  Patient presents with   Nasal Congestion   HPI Pleasant 37 year old male presenting today for evaluation of upper respiratory symptoms.  He reports that for the last 6 days or so, he has had sinus congestion, ear pressure, muffled hearing, sore throat, jaw pain, and a mild headache.  Notes that he gets a sinus infection usually once a year although this is a little early for him.  Has had no fevers, chills, or GI symptoms.  Has been taking Sudafed as well as using saline rinses.  The onset of this was very slow with gradual worsening of his symptoms.   Objective:    Vitals:   08/17/22 1041 08/17/22 1042  BP: 123/82 123/82  Pulse: 81   Resp: 20   Height: 6' (1.829 m)   Weight: 217 lb 11.2 oz (98.7 kg)   SpO2: 100% 100%  BMI (Calculated): 29.52     Physical Exam Vitals reviewed.  Constitutional:      General: He is not in acute distress.    Appearance: Normal appearance. He is obese. He is not ill-appearing.  HENT:     Head: Normocephalic and atraumatic.     Right Ear: Tympanic membrane, ear canal and external ear normal. There is no impacted cerumen.     Left Ear: Tympanic membrane, ear canal and external ear normal. There is no impacted cerumen.     Nose: Nose normal.     Mouth/Throat:     Mouth: Mucous membranes are moist.     Pharynx: Posterior oropharyngeal erythema present.  Eyes:     General: No scleral icterus.       Right eye: No discharge.        Left eye: No discharge.     Extraocular Movements: Extraocular movements intact.     Conjunctiva/sclera: Conjunctivae normal.     Pupils: Pupils are equal, round, and reactive to light.  Cardiovascular:     Rate and Rhythm: Normal rate and regular rhythm.     Pulses: Normal pulses.     Heart sounds: Normal heart sounds. No murmur heard.    No friction rub. No  gallop.  Pulmonary:     Effort: Pulmonary effort is normal. No respiratory distress.     Breath sounds: Normal breath sounds.  Lymphadenopathy:     Cervical: No cervical adenopathy.  Skin:    General: Skin is warm and dry.  Neurological:     Mental Status: He is alert and oriented to person, place, and time.  Psychiatric:        Mood and Affect: Mood normal.        Behavior: Behavior normal.        Thought Content: Thought content normal.        Judgment: Judgment normal.    Results for orders placed or performed in visit on 08/17/22 (from the past 24 hour(s))  POC COVID-19     Status: None   Collection Time: 08/17/22 11:39 AM  Result Value Ref Range   SARS Coronavirus 2 Ag Negative Negative  POCT rapid strep A     Status: None   Collection Time: 08/17/22 11:41 AM  Result Value Ref Range   Rapid Strep A Screen Negative Negative  POCT Influenza A/B     Status: Abnormal   Collection Time: 08/17/22 11:41 AM  Result  Value Ref Range   Influenza A, POC Negative Negative   Influenza B, POC Positive (A) Negative       The ASCVD Risk score (Arnett DK, et al., 2019) failed to calculate for the following reasons:   The 2019 ASCVD risk score is only valid for ages 44 to 19   Assessment & Plan:   1. Sore throat 2. Congestion of nasal sinus POCT strep and COVID-negative.  POCT flu B positive. - POCT rapid strep A - POCT Influenza A/B - POC COVID-19  3. Influenza B On day 6 of symptoms so outside of the window for Tamiflu.  Recommend conservative management with over-the-counter cold and flu preparations.  Okay to use Tylenol/ibuprofen for sore throat.  Discussed sore throat treatment with salt water gargles, Chloraseptic, Sucrets, etc.  Sending in Magic mouthwash for as needed use for sore throat.  Since his symptoms have continued to worsen and he is having thick green nasal discharge for the last several days, I will go ahead and send in azithromycin but recommend to hold off on  starting the medication until Saturday and only then if no improvement in his symptoms.  Patient verbalized understanding and is agreeable to the plan.  Return if symptoms worsen or fail to improve.  ___________________________________________ Clearnce Sorrel, DNP, APRN, FNP-BC [facility] [place]

## 2022-08-25 ENCOUNTER — Other Ambulatory Visit: Payer: Self-pay | Admitting: Medical-Surgical

## 2022-08-25 DIAGNOSIS — F32A Depression, unspecified: Secondary | ICD-10-CM

## 2022-09-25 ENCOUNTER — Other Ambulatory Visit: Payer: Self-pay | Admitting: Medical-Surgical

## 2022-09-25 DIAGNOSIS — F419 Anxiety disorder, unspecified: Secondary | ICD-10-CM

## 2023-08-07 ENCOUNTER — Encounter: Payer: BC Managed Care – PPO | Admitting: Medical-Surgical

## 2023-08-09 ENCOUNTER — Other Ambulatory Visit: Payer: Self-pay | Admitting: Medical-Surgical

## 2023-08-09 DIAGNOSIS — I10 Essential (primary) hypertension: Secondary | ICD-10-CM

## 2023-08-09 NOTE — Telephone Encounter (Signed)
 Pls contact the pt to schedule appt for HTN with [place]. Past due since 07/2023. Sending 30 day refill. Thanks

## 2023-08-10 NOTE — Telephone Encounter (Signed)
 Called patient, LVM for patient to call office to schedule appt, thanks.

## 2023-08-23 ENCOUNTER — Ambulatory Visit (INDEPENDENT_AMBULATORY_CARE_PROVIDER_SITE_OTHER): Payer: BC Managed Care – PPO | Admitting: Medical-Surgical

## 2023-08-23 VITALS — BP 106/65 | HR 75 | Resp 20 | Ht 72.0 in | Wt 209.0 lb

## 2023-08-23 DIAGNOSIS — Z23 Encounter for immunization: Secondary | ICD-10-CM | POA: Diagnosis not present

## 2023-08-23 DIAGNOSIS — I10 Essential (primary) hypertension: Secondary | ICD-10-CM

## 2023-08-23 DIAGNOSIS — Z0000 Encounter for general adult medical examination without abnormal findings: Secondary | ICD-10-CM

## 2023-08-23 MED ORDER — OLMESARTAN MEDOXOMIL 40 MG PO TABS: 40.0000 mg | ORAL_TABLET | Freq: Every day | ORAL | 3 refills | Status: AC

## 2023-08-23 NOTE — Progress Notes (Signed)
Complete physical exam  Patient: Ryan Hubbard   DOB: 03-Sep-1985   37 y.o. Male  MRN: [id number]  Subjective:    Chief Complaint  Patient presents with   Annual Exam    Yang Rack is a 38 y.o. male who presents today for a complete physical exam. He reports consuming a general diet.  Doing cardio and weights five days a week with Peloton, Treadmill, and dumbbells.   He generally feels well. He reports sleeping fairly well. He does have additional problems to discuss today.    Most recent fall risk assessment:    08/03/2022    8:51 AM  Fall Risk   Falls in the past year? 0  Number falls in past yr: 0  Injury with Fall? 0  Risk for fall due to : No Fall Risks  Follow up Falls evaluation completed     Most recent depression screenings:    08/23/2023    9:32 AM 08/17/2022   11:02 AM  PHQ 2/9 Scores  PHQ - 2 Score 0 0    Vision:Within last year, Dental: No current dental problems and Receives regular dental care, and STD: The patient denies history of sexually transmitted disease.    Patient Care Team: Christen Butter, NP as PCP - General (Nurse Practitioner) Monica Becton, MD as Consulting Physician (Sports Medicine)   Outpatient Medications Prior to Visit  Medication Sig   fluticasone (FLONASE) 50 MCG/ACT nasal spray Place 1-2 sprays into both nostrils daily.   Omega-3 Fatty Acids (FISH OIL) 1000 MG CPDR Take 2 capsules by mouth daily.   [DISCONTINUED] olmesartan (BENICAR) 40 MG tablet TAKE 1 TABLET BY MOUTH DAILY   [DISCONTINUED] hydrOXYzine (ATARAX) 50 MG tablet Take 1 tablet (50 mg total) by mouth 3 (three) times daily as needed. for anxiety   [DISCONTINUED] sertraline (ZOLOFT) 25 MG tablet TAKE 1 TABLET BY MOUTH DAILY   No facility-administered medications prior to visit.    Review of Systems  Constitutional:  Negative for chills, fever, malaise/fatigue and weight loss.  HENT:  Positive for congestion. Negative for ear pain,  hearing loss, sinus pain and sore throat.   Eyes:  Negative for blurred vision, photophobia and pain.  Respiratory:  Negative for cough, shortness of breath and wheezing.   Cardiovascular:  Negative for chest pain, palpitations and leg swelling.  Gastrointestinal:  Negative for abdominal pain, constipation, diarrhea, heartburn, nausea and vomiting.  Genitourinary:  Negative for dysuria, frequency and urgency.  Musculoskeletal:  Negative for falls and neck pain.  Skin:  Negative for itching and rash.  Neurological:  Negative for dizziness, weakness and headaches.  Endo/Heme/Allergies:  Negative for polydipsia. Does not bruise/bleed easily.  Psychiatric/Behavioral:  Negative for depression, substance abuse and suicidal ideas. The patient has insomnia (intermittently). The patient is not nervous/anxious.      Objective:     BP 106/65 (BP Location: Right Arm, Cuff Size: Large)   Pulse 75   Resp 20   Ht 6' (1.829 m)   Wt 209 lb (94.8 kg)   SpO2 99%   BMI 28.35 kg/m    Physical Exam   No results found for any visits on 08/23/23.     Assessment & Plan:    Routine Health Maintenance and Physical Exam  Immunization History  Administered Date(s) Administered   Hepatitis B 03/19/1997, 05/17/1997, 09/24/1997   Influenza, Seasonal, Injecte, Preservative Fre 08/23/2023   Influenza,inj,Quad PF,6+ Mos 08/15/2018, 05/04/2021, 04/01/2022   Moderna Sars-Covid-2 Vaccination 06/09/2020  PFIZER(Purple Top)SARS-COV-2 Vaccination 09/27/2019, 10/18/2019, 05/04/2021   Tdap 09/10/2012, 08/23/2023    Health Maintenance  Topic Date Due   Hepatitis C Screening  08/22/2024 (Originally 01/24/2004)   HIV Screening  08/22/2024 (Originally 01/23/2001)   DTaP/Tdap/Td (3 - Td or Tdap) 08/22/2033   INFLUENZA VACCINE  Completed   HPV VACCINES  Aged Out   COVID-19 Vaccine  Discontinued    Discussed health benefits of physical activity, and encouraged him to engage in regular exercise appropriate for his  age and condition.  1. Annual physical exam Up-to-date on preventative care.  Checking labs as below.  Wellness information provided with AVS. - CBC with Differential/Platelet - CMP14+EGFR  2. Essential hypertension, benign Blood pressure very well-controlled.  Continue dietary and lifestyle modifications.  Checking labs.  Continue Benicar 40 mg daily.  Monitor for hypotension and if this occurs, advised him to reach out through MyChart to let me know so we can discuss medication dose adjustments. - olmesartan (BENICAR) 40 MG tablet; Take 1 tablet (40 mg total) by mouth daily.  Dispense: 90 tablet; Refill: 3 - CBC with Differential/Platelet - CMP14+EGFR - Lipid panel  3. Need for influenza vaccination (Primary) Flu vaccine given in office today. - Flu vaccine trivalent PF, 6mos and older(Flulaval,Afluria,Fluarix,Fluzone)  4. Need for tetanus booster Tetanus vaccine given in office today. - Tdap vaccine greater than or equal to 7yo IM  Return in about 1 year (around 08/22/2024) for annual physical exam or sooner if needed.   Christen Butter, NP

## 2023-08-23 NOTE — Patient Instructions (Addendum)
Preventive Care 38-38 Years Old, Male Preventive care refers to lifestyle choices and visits with your health care provider that can promote health and wellness. Preventive care visits are also called wellness exams. What can I expect for my preventive care visit? Counseling During your preventive care visit, your health care provider may ask about your: Medical history, including: Past medical problems. Family medical history. Current health, including: Emotional well-being. Home life and relationship well-being. Sexual activity. Lifestyle, including: Alcohol, nicotine or tobacco, and drug use. Access to firearms. Diet, exercise, and sleep habits. Safety issues such as seatbelt and bike helmet use. Sunscreen use. Work and work Astronomer. Physical exam Your health care provider may check your: Height and weight. These may be used to calculate your BMI (body mass index). BMI is a measurement that tells if you are at a healthy weight. Waist circumference. This measures the distance around your waistline. This measurement also tells if you are at a healthy weight and may help predict your risk of certain diseases, such as type 2 diabetes and high blood pressure. Heart rate and blood pressure. Body temperature. Skin for abnormal spots. What immunizations do I need?  Vaccines are usually given at various ages, according to a schedule. Your health care provider will recommend vaccines for you based on your age, medical history, and lifestyle or other factors, such as travel or where you work. What tests do I need? Screening Your health care provider may recommend screening tests for certain conditions. This may include: Lipid and cholesterol levels. Diabetes screening. This is done by checking your blood sugar (glucose) after you have not eaten for a while (fasting). Hepatitis B test. Hepatitis C test. HIV (human immunodeficiency virus) test. STI (sexually transmitted infection)  testing, if you are at risk. Talk with your health care provider about your test results, treatment options, and if necessary, the need for more tests. Follow these instructions at home: Eating and drinking  Eat a healthy diet that includes fresh fruits and vegetables, whole grains, lean protein, and low-fat dairy products. Drink enough fluid to keep your urine pale yellow. Take vitamin and mineral supplements as recommended by your health care provider. Do not drink alcohol if your health care provider tells you not to drink. If you drink alcohol: Limit how much you have to 0-2 drinks a day. Know how much alcohol is in your drink. In the [place] one drink equals one 12 oz bottle of beer (355 mL), one 5 oz glass of wine (148 mL), or one 1 oz glass of hard liquor (44 mL). Lifestyle Brush your teeth every morning and night with fluoride toothpaste. Floss one time each day. Exercise for at least 30 minutes 5 or more days each week. Do not use any products that contain nicotine or tobacco. These products include cigarettes, chewing tobacco, and vaping devices, such as e-cigarettes. If you need help quitting, ask your health care provider. Do not use drugs. If you are sexually active, practice safe sex. Use a condom or other form of protection to prevent STIs. Find healthy ways to manage stress, such as: Meditation, yoga, or listening to music. Journaling. Talking to a trusted person. Spending time with friends and family. Minimize exposure to UV radiation to reduce your risk of skin cancer. Safety Always wear your seat belt while driving or riding in a vehicle. Do not drive: If you have been drinking alcohol. Do not ride with someone who has been drinking. If you have been using any mind-altering substances  or drugs. While texting. When you are tired or distracted. Wear a helmet and other protective equipment during sports activities. If you have firearms in your house, make sure you  follow all gun safety procedures. Seek help if you have been physically or sexually abused. What's next? Go to your health care provider once a year for an annual wellness visit. Ask your health care provider how often you should have your eyes and teeth checked. Stay up to date on all vaccines. This information is not intended to replace advice given to you by your health care provider. Make sure you discuss any questions you have with your health care provider. Document Revised: 12/16/2020 Document Reviewed: 12/16/2020 Elsevier Patient Education  2024 [employer] chain labs as below.

## 2023-08-24 ENCOUNTER — Encounter: Payer: Self-pay | Admitting: Medical-Surgical

## 2023-08-24 LAB — CBC WITH DIFFERENTIAL/PLATELET
Basophils Absolute: 0 10*3/uL (ref 0.0–0.2)
Basos: 0 %
EOS (ABSOLUTE): 0.2 10*3/uL (ref 0.0–0.4)
Eos: 2 %
Hematocrit: 43.5 % (ref 37.5–51.0)
Hemoglobin: 14.6 g/dL (ref 13.0–17.7)
Immature Grans (Abs): 0 10*3/uL (ref 0.0–0.1)
Immature Granulocytes: 0 %
Lymphocytes Absolute: 3.4 10*3/uL — ABNORMAL HIGH (ref 0.7–3.1)
Lymphs: 43 %
MCH: 32.2 pg (ref 26.6–33.0)
MCHC: 33.6 g/dL (ref 31.5–35.7)
MCV: 96 fL (ref 79–97)
Monocytes Absolute: 0.6 10*3/uL (ref 0.1–0.9)
Monocytes: 7 %
Neutrophils Absolute: 3.8 10*3/uL (ref 1.4–7.0)
Neutrophils: 48 %
Platelets: 323 10*3/uL (ref 150–450)
RBC: 4.53 x10E6/uL (ref 4.14–5.80)
RDW: 12.4 % (ref 11.6–15.4)
WBC: 7.9 10*3/uL (ref 3.4–10.8)

## 2023-08-24 LAB — LIPID PANEL
Chol/HDL Ratio: 3.2 {ratio} (ref 0.0–5.0)
Cholesterol, Total: 186 mg/dL (ref 100–199)
HDL: 59 mg/dL (ref >39)
LDL Chol Calc (NIH): 114 mg/dL — ABNORMAL HIGH (ref 0–99)
Triglycerides: 70 mg/dL (ref 0–149)
VLDL Cholesterol Cal: 13 mg/dL (ref 5–40)

## 2023-08-24 LAB — CMP14+EGFR
ALT: 37 [IU]/L (ref 0–44)
AST: 29 [IU]/L (ref 0–40)
Albumin: 4.9 g/dL (ref 4.1–5.1)
Alkaline Phosphatase: 87 [IU]/L (ref 44–121)
BUN/Creatinine Ratio: 12 (ref 9–20)
BUN: 14 mg/dL (ref 6–20)
Bilirubin Total: 0.8 mg/dL (ref 0.0–1.2)
CO2: 22 mmol/L (ref 20–29)
Calcium: 9.8 mg/dL (ref 8.7–10.2)
Chloride: 101 mmol/L (ref 96–106)
Creatinine, Ser: 1.14 mg/dL (ref 0.76–1.27)
Globulin, Total: 2.2 g/dL (ref 1.5–4.5)
Glucose: 91 mg/dL (ref 70–99)
Potassium: 4.5 mmol/L (ref 3.5–5.2)
Sodium: 139 mmol/L (ref 134–144)
Total Protein: 7.1 g/dL (ref 6.0–8.5)
eGFR: 85 mL/min/{1.73_m2} (ref >59)

## 2024-01-16 ENCOUNTER — Ambulatory Visit (INDEPENDENT_AMBULATORY_CARE_PROVIDER_SITE_OTHER): Admitting: Sports Medicine

## 2024-01-16 ENCOUNTER — Ambulatory Visit

## 2024-01-16 ENCOUNTER — Encounter: Payer: Self-pay | Admitting: Sports Medicine

## 2024-01-16 DIAGNOSIS — G8929 Other chronic pain: Secondary | ICD-10-CM | POA: Diagnosis not present

## 2024-01-16 DIAGNOSIS — M545 Low back pain, unspecified: Secondary | ICD-10-CM | POA: Diagnosis not present

## 2024-01-16 MED ORDER — PREDNISONE 50 MG PO TABS: ORAL_TABLET | ORAL | 0 refills | Status: DC

## 2024-01-16 NOTE — Assessment & Plan Note (Signed)
 Pleasant 38 year old male, I saw him approximately 3 years ago for axial low back pain, discogenic, he did well with conservative treatment, unfortunately he is having recurrence of discomfort albeit mild, left-sided quadratus lumborum, worse with sitting, flexion, Valsalva, nothing overtly radicular, no red flag symptoms. We discussed the anatomy and pathophysiology of lumbar disc disease which is what this sounds like, he will work on aggressive home core conditioning, we will get x-rays, due to the years of symptomatology we will also go ahead and get the MRI. We will do 6 weeks of conservative treatment before considering interventional planning. I am going to give him some prednisone  to keep on hand although he is not hurting enough today to take it.

## 2024-01-16 NOTE — Progress Notes (Signed)
    Procedures performed today:    None.  Independent interpretation of notes and tests performed by another provider:   None.  Brief History, Exam, Impression, and Recommendations:    Chronic low back pain Pleasant 38 year old male, I saw him approximately 3 years ago for axial low back pain, discogenic, he did well with conservative treatment, unfortunately he is having recurrence of discomfort albeit mild, left-sided quadratus lumborum, worse with sitting, flexion, Valsalva, nothing overtly radicular, no red flag symptoms. We discussed the anatomy and pathophysiology of lumbar disc disease which is what this sounds like, he will work on aggressive home core conditioning, we will get x-rays, due to the years of symptomatology we will also go ahead and get the MRI. We will do 6 weeks of conservative treatment before considering interventional planning. I am going to give him some prednisone  to keep on hand although he is not hurting enough today to take it.    ____________________________________________ Debby PARAS. Curtis, M.D., ABFM., CAQSM., AME. Primary [facility] Sports Medicine [facility]  Adjunct Professor of [facility] Medicine  [employer]  Restaurant manager, fast food

## 2024-01-18 ENCOUNTER — Ambulatory Visit: Payer: Self-pay | Admitting: Sports Medicine

## 2024-01-18 ENCOUNTER — Encounter: Payer: Self-pay | Admitting: Sports Medicine

## 2024-01-19 NOTE — Telephone Encounter (Signed)
 No, I sent it thank you.

## 2024-02-27 ENCOUNTER — Ambulatory Visit: Admitting: Sports Medicine

## 2024-02-27 ENCOUNTER — Telehealth: Payer: Self-pay | Admitting: Medical-Surgical

## 2024-02-27 DIAGNOSIS — G8929 Other chronic pain: Secondary | ICD-10-CM

## 2024-02-27 NOTE — Telephone Encounter (Signed)
 Copied from CRM [id number]. Topic: Referral - Request for Referral >> Feb 27, 2024  8:34 AM Hamdi H wrote: Did the patient discuss referral with their provider in the last year? No (If No - schedule appointment)  Patient of Dr. ONEIDA, he needs somewhere else to go to for his spine issues since Dr. ONEIDA left the clinic.  (If Yes - send message)  Appointment offered? No, Dr. ONEIDA patient looking to be referred to another location especially since his insurance is out of network for [place].   Type of order/referral and detailed reason for visit: Issues with his spine   Preference of office, provider, location: [facility] in [place]   If referral order, have you been seen by this specialty before? No (If Yes, this issue or another issue? When? Where?  Can we respond through MyChart? Yes

## 2024-03-05 ENCOUNTER — Encounter: Payer: Self-pay | Admitting: Medical-Surgical

## 2024-03-05 ENCOUNTER — Encounter: Payer: Self-pay | Admitting: Sports Medicine

## 2024-05-22 NOTE — Progress Notes (Unsigned)

## 2024-05-23 ENCOUNTER — Ambulatory Visit: Admitting: Medical-Surgical

## 2024-05-23 ENCOUNTER — Encounter: Payer: Self-pay | Admitting: Medical-Surgical

## 2024-05-23 VITALS — BP 139/83 | HR 74 | Temp 98.2°F | Resp 20 | Ht 72.0 in | Wt 215.1 lb

## 2024-05-23 DIAGNOSIS — J0140 Acute pansinusitis, unspecified: Secondary | ICD-10-CM

## 2024-05-23 MED ORDER — AMOXICILLIN-POT CLAVULANATE 875-125 MG PO TABS: 1.0000 | ORAL_TABLET | Freq: Two times a day (BID) | ORAL | 0 refills | Status: DC

## 2024-06-13 ENCOUNTER — Ambulatory Visit (INDEPENDENT_AMBULATORY_CARE_PROVIDER_SITE_OTHER): Admitting: Medical-Surgical

## 2024-06-13 ENCOUNTER — Encounter: Payer: Self-pay | Admitting: Medical-Surgical

## 2024-06-13 VITALS — BP 121/77 | HR 69 | Resp 20 | Ht 72.0 in | Wt 216.0 lb

## 2024-06-13 DIAGNOSIS — R109 Unspecified abdominal pain: Secondary | ICD-10-CM

## 2024-06-13 DIAGNOSIS — Z23 Encounter for immunization: Secondary | ICD-10-CM

## 2024-06-13 MED ORDER — CYCLOBENZAPRINE HCL 10 MG PO TABS: 5.0000 mg | ORAL_TABLET | Freq: Three times a day (TID) | ORAL | 0 refills | Status: DC | PRN

## 2024-06-13 MED ORDER — PANTOPRAZOLE SODIUM 40 MG PO TBEC: 40.0000 mg | DELAYED_RELEASE_TABLET | Freq: Every day | ORAL | 1 refills | Status: DC

## 2024-06-13 NOTE — Progress Notes (Signed)
 Medical screening examination/treatment was performed by qualified clinical staff member and as supervising provider I was immediately available for consultation/collaboration. I have reviewed documentation and agree with assessment and plan.  [name], DNP, APRN, FNP-BC [facility] and Sports Medicine

## 2024-06-13 NOTE — Progress Notes (Addendum)
 Acute Office Visit  Subjective:     Patient ID: Ryan Hubbard, male    DOB: [birth year], 38 y.o.   MRN: [id number]  Chief Complaint  Patient presents with   Medical Management of Chronic Issues    Having feeling of pressure in the diaphragm mostly at night when trying to sleep. On/Off for a year and a half. Past 3 days it has been consistent.    HPI Patient is in today for upper abdominal pressure that has been intermittent for about a year and half. Current episode has been present for 3 days. Reports that symptoms are worse at night where if feels like a band is wrapped around my entire abdomen. Has tried taking an antiacid with intermittent relief. Rates pain as a 6-7/10 when laying flat. Sleeping upright helps relieve symptoms. Reports daily bowel movements. Denies hard bowel movements, straining, nausea, vomiting, history of hernia, heart palpitations, or anxiety.  Review of Systems  Constitutional: Negative.   HENT: Negative.    Eyes: Negative.   Respiratory: Negative.    Cardiovascular: Negative.   Gastrointestinal:  Positive for abdominal pain.       Abdominal pressure  Genitourinary: Negative.   Musculoskeletal: Negative.   Skin: Negative.   Neurological: Negative.   Endo/Heme/Allergies: Negative.   Psychiatric/Behavioral: Negative.          Objective:    BP 136/86 (BP Location: Left Arm, Cuff Size: Normal)   Pulse 72   Resp 20   Ht 6' (1.829 m)   Wt 98 kg   SpO2 98%   BMI 29.29 kg/m  BP Readings from Last 3 Encounters:  06/13/24 136/86  05/23/24 139/83  08/23/23 106/65      Physical Exam Vitals and nursing note reviewed.  Constitutional:      General: He is not in acute distress.    Appearance: Normal appearance.  Cardiovascular:     Rate and Rhythm: Normal rate and regular rhythm.     Pulses: Normal pulses.     Heart sounds: Normal heart sounds.  Pulmonary:     Effort: Pulmonary effort is normal.     Breath sounds: Normal breath  sounds.  Abdominal:     General: Bowel sounds are normal.     Palpations: Abdomen is soft.     Tenderness: There is no abdominal tenderness.     Hernia: No hernia is present.  Neurological:     General: No focal deficit present.     Mental Status: He is alert and oriented to person, place, and time.  Psychiatric:        Mood and Affect: Mood normal.        Behavior: Behavior normal.        Thought Content: Thought content normal.        Judgment: Judgment normal.     No results found for any visits on 06/13/24.      Assessment & Plan:  1. Abdominal pressure (Primary) -Trial of Protonix sent to the pharmacy to treat for Reflux -CT abdomen and pelvis ordered to rule out hernia  - Flexeril sent to the pharmacy to treat for muscle spasms - pantoprazole (PROTONIX) 40 MG tablet; Take 1 tablet (40 mg total) by mouth daily.  Dispense: 30 tablet; Refill: 1 - CT ABDOMEN PELVIS WO CONTRAST; Future - cyclobenzaprine (FLEXERIL) 10 MG tablet; Take 0.5-1 tablets (5-10 mg total) by mouth 3 (three) times daily as needed.  Dispense: 30 tablet; Refill: 0   Problem List Items  Addressed This Visit   None Visit Diagnoses       Abdominal pressure    -  Primary   Relevant Medications   pantoprazole (PROTONIX) 40 MG tablet   cyclobenzaprine (FLEXERIL) 10 MG tablet   Other Relevant Orders   CT ABDOMEN PELVIS WO CONTRAST     Need for influenza vaccination       Relevant Orders   Flu vaccine trivalent PF, 6mos and older(Flulaval,Afluria,Fluarix,Fluzone)       Meds ordered this encounter  Medications   pantoprazole (PROTONIX) 40 MG tablet    Sig: Take 1 tablet (40 mg total) by mouth daily.    Dispense:  30 tablet    Refill:  1   cyclobenzaprine (FLEXERIL) 10 MG tablet    Sig: Take 0.5-1 tablets (5-10 mg total) by mouth 3 (three) times daily as needed.    Dispense:  30 tablet    Refill:  0    Supervising Provider:   ALVIA BRING [[id number]]    Return in about 4 weeks (around 07/11/2024) for  abdominal pressure/pain follow up.  Derrek JINNY Freund, NP Student

## 2024-06-18 ENCOUNTER — Other Ambulatory Visit

## 2024-06-18 DIAGNOSIS — R109 Unspecified abdominal pain: Secondary | ICD-10-CM

## 2024-06-21 ENCOUNTER — Ambulatory Visit: Payer: Self-pay | Admitting: Medical-Surgical

## 2024-07-18 ENCOUNTER — Ambulatory Visit: Admitting: Medical-Surgical

## 2024-07-24 ENCOUNTER — Encounter: Payer: Self-pay | Admitting: Medical-Surgical

## 2024-07-24 ENCOUNTER — Ambulatory Visit (INDEPENDENT_AMBULATORY_CARE_PROVIDER_SITE_OTHER): Admitting: Medical-Surgical

## 2024-07-24 VITALS — BP 123/78 | HR 76 | Temp 98.5°F | Resp 16 | Ht 72.0 in | Wt 214.0 lb

## 2024-07-24 DIAGNOSIS — I10 Essential (primary) hypertension: Secondary | ICD-10-CM

## 2024-07-24 DIAGNOSIS — R109 Unspecified abdominal pain: Secondary | ICD-10-CM

## 2024-07-24 MED ORDER — PANTOPRAZOLE SODIUM 20 MG PO TBEC: 20.0000 mg | DELAYED_RELEASE_TABLET | Freq: Every day | ORAL | 1 refills | Status: AC | PRN

## 2024-07-24 NOTE — Progress Notes (Signed)
" ° °       Established patient visit   History of Present Illness   Discussed the use of AI scribe software for clinical note transcription with the patient, who gave verbal consent to proceed.  History of Present Illness   Ryan Hubbard is a 39 year old male with a history of kidney stones who presents with gastrointestinal issues.  Altered bowel habits - Constipation and irregular bowel movements during a period of increased stress from a new job and marathon training - Stool pattern now returning toward normal - Stress frequently triggers constipation or diarrhea  Gastroesophageal reflux symptoms - Protonix  40mg  daily taken for approximately one month - Resolution of abdominal pain, pressure, nocturnal discomfort, and heartburn with medication  Nephrolithiasis - Recent CT demonstrated multiple left renal stones up to 4 mm in size  Hypertension - Treated with long-term Benicar  - Previously trialed hydrochlorothiazide  monotherapy without adequate effect - Home blood pressure readings typically 125 to 135 mmHg systolic     Physical Exam   Physical Exam Vitals and nursing note reviewed.  Constitutional:      General: He is not in acute distress.    Appearance: Normal appearance. He is not ill-appearing.  HENT:     Head: Normocephalic and atraumatic.  Cardiovascular:     Rate and Rhythm: Normal rate and regular rhythm.     Pulses: Normal pulses.     Heart sounds: Normal heart sounds. No murmur heard.    No friction rub. No gallop.  Pulmonary:     Effort: Pulmonary effort is normal. No respiratory distress.     Breath sounds: Normal breath sounds.  Skin:    General: Skin is warm and dry.  Neurological:     Mental Status: He is alert and oriented to person, place, and time.  Psychiatric:        Mood and Affect: Mood normal.        Behavior: Behavior normal.        Thought Content: Thought content normal.        Judgment: Judgment normal.    Assessment &  Plan   Assessment and Plan    Essential hypertension Blood pressure well-controlled with current regimen. Recent reading 132 mmHg, slightly above target but not concerning. No symptoms reported. - Recheck of BP at goal.  - Continue Benicar  40mg  daily. - Monitor blood pressure at home with a goal of less than 130/80.  Abdominal pain/pressure GERD symptoms improved with Protonix . No recent heartburn or abdominal pain. Plan to taper medication to avoid rebound reflux. - Reduce Protonix  to 20 mg daily for two weeks, then use daily as needed.  Nonobstructive left renal calculi Multiple small nonobstructive left renal calculi up to 4 mm. No symptoms or obstruction. Discussed potential for stones to remain asymptomatic or cause pain if he moves. - Monitor for symptoms of stone movement or obstruction.     Follow up   Return if symptoms worsen or fail to improve. __________________________________ Zada FREDRIK Palin, DNP, APRN, FNP-BC [facility] "

## 2024-08-23 ENCOUNTER — Encounter: Payer: BC Managed Care – PPO | Admitting: Medical-Surgical
